# Patient Record
Sex: Male | Born: 1965 | Race: White | Hispanic: No | Marital: Married | Smoking: Never smoker
Health system: Southern US, Community
[De-identification: ages and names within clinical notes are randomized; demographics above are authoritative.]

## PROBLEM LIST (undated history)

## (undated) DIAGNOSIS — E039 Hypothyroidism, unspecified: Secondary | ICD-10-CM

## (undated) DIAGNOSIS — M1990 Unspecified osteoarthritis, unspecified site: Secondary | ICD-10-CM

## (undated) DIAGNOSIS — E079 Disorder of thyroid, unspecified: Secondary | ICD-10-CM

## (undated) DIAGNOSIS — I10 Essential (primary) hypertension: Secondary | ICD-10-CM

## (undated) DIAGNOSIS — J189 Pneumonia, unspecified organism: Secondary | ICD-10-CM

## (undated) DIAGNOSIS — Z8639 Personal history of other endocrine, nutritional and metabolic disease: Secondary | ICD-10-CM

## (undated) HISTORY — PX: JOINT REPLACEMENT: SHX530

---

## 2005-11-28 ENCOUNTER — Inpatient Hospital Stay (HOSPITAL_COMMUNITY): Admission: RE | Admit: 2005-11-28 | Discharge: 2005-12-01 | Payer: Self-pay | Admitting: Orthopedic Surgery

## 2005-12-04 ENCOUNTER — Encounter: Payer: Self-pay | Admitting: Vascular Surgery

## 2005-12-04 ENCOUNTER — Ambulatory Visit (HOSPITAL_COMMUNITY): Admission: RE | Admit: 2005-12-04 | Discharge: 2005-12-04 | Payer: Self-pay | Admitting: Orthopedic Surgery

## 2008-04-13 ENCOUNTER — Inpatient Hospital Stay (HOSPITAL_COMMUNITY): Admission: RE | Admit: 2008-04-13 | Discharge: 2008-04-17 | Payer: Self-pay | Admitting: Orthopedic Surgery

## 2009-03-05 HISTORY — PX: TOTAL THYROIDECTOMY: SHX2547

## 2010-06-20 LAB — CBC
HCT: 28.2 % — ABNORMAL LOW (ref 39.0–52.0)
HCT: 29 % — ABNORMAL LOW (ref 39.0–52.0)
HCT: 29.8 % — ABNORMAL LOW (ref 39.0–52.0)
HCT: 37.7 % — ABNORMAL LOW (ref 39.0–52.0)
Hemoglobin: 10 g/dL — ABNORMAL LOW (ref 13.0–17.0)
Hemoglobin: 10.1 g/dL — ABNORMAL LOW (ref 13.0–17.0)
Hemoglobin: 12.4 g/dL — ABNORMAL LOW (ref 13.0–17.0)
Hemoglobin: 9.7 g/dL — ABNORMAL LOW (ref 13.0–17.0)
MCHC: 32.8 g/dL (ref 30.0–36.0)
MCHC: 34.4 g/dL (ref 30.0–36.0)
MCV: 74.4 fL — ABNORMAL LOW (ref 78.0–100.0)
MCV: 75.5 fL — ABNORMAL LOW (ref 78.0–100.0)
MCV: 76.2 fL — ABNORMAL LOW (ref 78.0–100.0)
Platelets: 166 10*3/uL (ref 150–400)
Platelets: 168 10*3/uL (ref 150–400)
Platelets: 169 10*3/uL (ref 150–400)
RBC: 3.75 MIL/uL — ABNORMAL LOW (ref 4.22–5.81)
RBC: 3.85 MIL/uL — ABNORMAL LOW (ref 4.22–5.81)
RBC: 3.89 MIL/uL — ABNORMAL LOW (ref 4.22–5.81)
RBC: 3.91 MIL/uL — ABNORMAL LOW (ref 4.22–5.81)
RBC: 4.99 MIL/uL (ref 4.22–5.81)
RDW: 15.4 % (ref 11.5–15.5)
WBC: 3.3 10*3/uL — ABNORMAL LOW (ref 4.0–10.5)
WBC: 5.7 10*3/uL (ref 4.0–10.5)
WBC: 6.1 10*3/uL (ref 4.0–10.5)
WBC: 6.2 10*3/uL (ref 4.0–10.5)
WBC: 8.5 10*3/uL (ref 4.0–10.5)

## 2010-06-20 LAB — URINALYSIS, ROUTINE W REFLEX MICROSCOPIC
Bilirubin Urine: NEGATIVE
Glucose, UA: NEGATIVE mg/dL
Ketones, ur: NEGATIVE mg/dL
Nitrite: NEGATIVE
Protein, ur: NEGATIVE mg/dL
pH: 5 (ref 5.0–8.0)

## 2010-06-20 LAB — COMPREHENSIVE METABOLIC PANEL
ALT: 55 U/L — ABNORMAL HIGH (ref 0–53)
BUN: 10 mg/dL (ref 6–23)
CO2: 24 mEq/L (ref 19–32)
Calcium: 9.9 mg/dL (ref 8.4–10.5)
GFR calc non Af Amer: >60 mL/min (ref >60)
Glucose, Bld: 108 mg/dL — ABNORMAL HIGH (ref 70–99)
Sodium: 139 mEq/L (ref 135–145)
Total Protein: 6.3 g/dL (ref 6.0–8.3)

## 2010-06-20 LAB — TYPE AND SCREEN: ABO/RH(D): O POS

## 2010-06-20 LAB — T4: T4, Total: 12.7 ug/dL — ABNORMAL HIGH (ref 5.0–12.5)

## 2010-06-20 LAB — BASIC METABOLIC PANEL
BUN: 5 mg/dL — ABNORMAL LOW (ref 6–23)
CO2: 27 mEq/L (ref 19–32)
CO2: 29 mEq/L (ref 19–32)
Chloride: 100 mEq/L (ref 96–112)
Chloride: 97 mEq/L (ref 96–112)
Potassium: 3.7 mEq/L (ref 3.5–5.1)
Potassium: 4.1 mEq/L (ref 3.5–5.1)

## 2010-06-20 LAB — PROTIME-INR
Prothrombin Time: 13.7 seconds (ref 11.6–15.2)
Prothrombin Time: 27.3 seconds — ABNORMAL HIGH (ref 11.6–15.2)

## 2010-06-20 LAB — URINE MICROSCOPIC-ADD ON

## 2010-06-20 LAB — THYROID ANTIBODIES
Thyroglobulin Ab: 1039.5 U/mL — ABNORMAL HIGH (ref 0.0–60.0)
Thyroperoxidase Ab SerPl-aCnc: 5267.1 U/mL — ABNORMAL HIGH (ref 0.0–60.0)

## 2010-06-20 LAB — APTT: aPTT: 34 seconds (ref 24–37)

## 2010-06-20 LAB — TSH: TSH: 0.011 u[IU]/mL — ABNORMAL LOW (ref 0.350–4.500)

## 2010-06-20 LAB — T3: T3, Total: 169.4 ng/dl (ref 80.0–204.0)

## 2010-07-18 NOTE — Op Note (Signed)
NAMEZYMIERE, TROSTLE                  ACCOUNT NO.:  [id number]   MEDICAL RECORD NO.:  [id number]          PATIENT TYPE:  INP   LOCATION:  0002                         FACILITY:  [facility]   PHYSICIAN:  Ollen Gross, M.D.    DATE OF BIRTH:  [birth year]   DATE OF PROCEDURE:  04/13/2008  DATE OF DISCHARGE:                               OPERATIVE REPORT   PREOPERATIVE DIAGNOSIS:  Avascular necrosis, left hip.   POSTOPERATIVE DIAGNOSIS:  Avascular necrosis, left hip.   PROCEDURE:  Left total hip arthroplasty.   SURGEON:  Dr. Lequita Halt.   ASSISTANT:  Avel Peace. P.A.-C.   ANESTHESIA:  General.   ESTIMATED BLOOD LOSS:  400.   DRAINS:  Hemovac x1.   COMPLICATIONS:  None.   CONDITION:  Stable to recovery.   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  He presents now for left total hip  arthroplasty.  He has had a previous successful right total hip  arthroplasty.   PROCEDURE IN DETAIL:  After successful administration of general  anesthetic, he was placed into the right lateral decubitus position with  left side up and held with the hip positioner.  The left lower extremity  was isolated from his perineum with plastic drapes and prepped and  draped in the usual sterile fashion.  Short posterolateral incision was  made with a 10 blade through subcutaneous tissue to the level of the  fascia lata was incised in line with the skin incision.  Sciatic nerve  was palpated and protected, and short external rotators isolated off the  femur.  Capsulectomy was performed, and his hip was dislocated.  The  center of the femoral head was marked, and a trial prosthesis was placed  such that the center of the trial head corresponds to the center of  native femoral head.  Osteotomy lines marked on the femoral neck and  osteotomy made with an oscillating saw.  The femoral  head was removed,  and the femur retracted anteriorly to gain acetabular exposure.   Acetabular retractors were placed and labrum and osteophytes removed.  Reaming started at 49 mm coursing in increments of 2 to 57 mm, and then  a 58-mm pinnacle acetabular shell was plate is impacted in anatomic  position with outstanding purchase.  We did not place any additional  dome screws.  The apex hole eliminator was placed, and then a permanent  40-mm neutral Ultamet metal liner was placed for metal-on-metal hip  replacement.   The femur was prepared with the canal finder and irrigation.  Axial  reaming was performed to 15.5 mm, proximal reaming to a 20 F and sleeve  machined to a large.  A 20 F large trial sleeve was placed with a 20 x  15 stem and a 36 plus 12 neck matching native anteversion.  The 40 plus  0 head was placed.  This reduces too easily.  We went to a 40 +3 which  had better tension.  The stability was outstanding with full extension,  full external rotation, 70 degrees flexion, 40 degrees adduction, 90  degrees internal rotation, 90 degrees of flexion and 70 degrees of  internal rotation.  By placing the left leg on top of the right knee it  felt as though the leg lengths were equal.  The hip was then dislocated  and trials removed.  The permanent 20 F large sleeve was placed with 20  x 15 stem, 36 plus 12 neck, matching native anteversion.  A 40 plus 3  head was placed and the hips reduced with same stability parameters.  The wound was copiously irrigated with saline solution and the short  rotators reattached to the femur through drill holes.  Fascia lata was  closed over a Hemovac drain with interrupted #1 Vicryl, subcutaneous  tissue closed with #1-0 and #2-0 Vicryl, and subcuticular running 4-0  Monocryl.  The incisions cleaned and dried, and Steri-Strips and bulky  sterile dressing were applied.  He was then placed into a knee  immobilizer, awakened and transported to  recovery in stable condition.      Ollen Gross, M.D.  Electronically Signed     FA/MEDQ  D:  04/13/2008  T:  04/13/2008  Job:  [id number]

## 2010-07-18 NOTE — Consult Note (Signed)
[name], [name]                  ACCOUNT NO.:  [id number]   MEDICAL RECORD NO.:  [id number]          PATIENT TYPE:  INP   LOCATION:  1603                         FACILITY:  [facility]   PHYSICIAN:  Reather Littler, M.D.       DATE OF BIRTH:  1965/10/19   DATE OF CONSULTATION:  DATE OF DISCHARGE:                                 CONSULTATION   REASON FOR CONSULTATION:  Probable hyperthyroidism.   HISTORY:  This is a 45 year old Caucasian male admitted for hip surgery  on April 13, 2008.  The patient apparently was found to have a pulse  rate of about 120 to 130 after surgery and was felt to have sinus  tachycardia of unclear etiology.  A TSH was done and was 0.01 and  endocrine consult was requested by the medical consultant.   On questioning the patient, he says that he has lost about 62 pounds in  the last 6 months.  He says that in October he had gastroenteritis with  vomiting and he thinks he lost weight at that time and he was apparently  sick for about 4 weeks; however, he has not regained any weight even  though his appetite is fairly normal.  He has not had any diarrhea.   Patient also has noticed that he has been fairly shaky, especially when  trying to eat in the last 3 to 4 months.  He did not think much about it  and has not discussed this with primary care physician.  He also has  some tendency to increased sweating although no significant heat  intolerance.  Patient does not think she feels excessively nervous or  short of breath.  His shakiness is a little better in the last day or so  with metoprolol that he had been started on.   PAST HISTORY:  1. Hip replacement, 2007.  2. Knee arthroscopy.   FAMILY HISTORY:  Negative for thyroid disease or diabetes.   PERSONAL HISTORY:  He is married.  He does not smoke currently.  He  lives in a town [address].   MEDICATIONS AT HOME:  He was not on any specific medications.   REVIEW OF SYSTEMS:  He has had some  problems with leg skin reactions  with allergies.  He has not had any hypertension, diabetes, recent  shortness of breath.  He had no change in his vision or headaches.   PHYSICAL EXAMINATION:  Patient is well-built and nourished.  He is  pleasant and cooperative.  He is in no acute distress.  His current  pulse is 100.  Blood pressure is 128/76.  EYES:  Externally normal.  There is no proptosis, lid lag, or stare.  ENT EXAM:  Shows normal mucous membranes.  NECK EXAM:  He has no lymphadenopathy or abnormal carotid.  His thyroid  is enlarged at least twice normal on the right side and 1-1/2 times  normal on the left side.  It is diffuse, smooth, and relatively firm.  CARDIAC EXAM:  Shows normal heart sounds and apex.  LUNGS:  Clear.  ABDOMEN:  No mass or tenderness.  EXTREMITIES:  Normal.  Reflexes are somewhat difficult to elicit but  appear slightly brisk.  He has no tremor.  His hands are not unusually  warm or sweaty.  He has no skin lesions or edema.   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  He surprisingly has only mild symptoms although he  does have tachycardia on exam.   PLAN:  Would be to continue beta-blockers increasing the dose to 150 mg  a day for better heart rate control.  He has been informed about the  options for treatment of his hyperthyroidism and he will discuss this  with his wife.  He will have an uptake and scan done while he is in the  hospital and we will also review his T4 an T3 levels when unavailable.  Thank you for the consultation.      Reather Littler, M.D.  Electronically Signed     AK/MEDQ  D:  04/15/2008  T:  04/15/2008  Job:  [id number]   cc:   Ollen Gross, M.D.  Fax: [phone]   Theodosia Paling, MD

## 2010-07-21 NOTE — H&P (Signed)
NAMECORY, [name]                  ACCOUNT NO.:  [id number]   MEDICAL RECORD NO.:  [id number]          PATIENT TYPE:  INP   LOCATION:                               FACILITY:  [facility]   PHYSICIAN:  Ollen Gross, M.D.    DATE OF BIRTH:  03-20-1965   DATE OF ADMISSION:  04/13/2008  DATE OF DISCHARGE:                              HISTORY & PHYSICAL   DATE OF ADMISSION:  April 13, 2008.   CHIEF COMPLAINT:  Painful left hip.   PRESENT ILLNESS:  The patient is a 45 year old gentleman with painful  range of motion, weightbearing and night pain in his left hip.  Evaluation showed that he did have bilateral hip AVN.  He has had a  right total hip arthroplasty.  Patient is now going to have a left total  hip arthroplasty due to the pain.   ALLERGIES:  No known drug allergies.   CURRENT MEDICATIONS:  Triamcinolone acetonide ointment for bilateral  lower extremity allergic reaction.   PAST MEDICAL HISTORY:  1. Includes AVN bilateral hip with a recent right total hip      arthroplasty.  2. Bilateral lower extremity allergic reaction.   REVIEW OF SYSTEMS:  Is negative for any neurologic issues.  He denies  any pulmonary issues.  CARDIOVASCULAR:  He denies any chest pains,  irregular heart rhythms, shortness of breath.  He did have a stress test  several years ago.  GI/GU:  Is unremarkable.  ENDOCRINE:  Is  unremarkable.  HEMATOLOGIC.  He did have some postoperative blood loss  anemia which resolved.   PAST SURGICAL HISTORY:  Includes a hip replacement in 2007, left knee  arthroscopy for ligament damage.  He denies any anesthesia  complications.   FAMILY MEDICAL HISTORY:  Leukemia on his father's side.   SOCIAL HISTORY:  The patient is married.  He has smoked in the past.  He  does not use street drugs or alcohol.  He lives with his family in two-  level single family home.   PHYSICAL EXAM:  VITALS:  Height is 6 feet 2 inches, weight is 250  pounds.  Blood pressure was 124/64, pulse  of 70, respirations 12,  patient is afebrile.  GENERAL:  This is a healthy-appearing, well-developed gentleman  conscious, alert and appropriate.  He appears to be a good historian.  HEENT:  Head was normocephalic.  Pupils equal, round and reactive.  Gross hearing is intact.  NECK:  Was supple.  No palpable lymphadenopathy.  Good range of motion.  CHEST:  Lung sounds were clear and equal bilaterally.  No wheezes,  rales, rhonchi.  HEART:  Regular rate and rhythm.  ABDOMEN:  Was soft, nontender.  Bowel sounds present.  EXTREMITIES:  The patient had good range of motion of his upper  extremities.  Lower Extremities:  Left hip had full extension.  He was  only able to flex it up to 90 degrees.  He had 30 degrees internal  rotation, 10 degrees of external rotation.  Right hip had full  extension, flexion up to 110.  He had  30 degrees of internal rotation,  20 degrees of external rotation.  PERIPHERAL VASCULAR:  The patient had 2+ carotid pulses.  No bruits.  Radial pulses were 2+, posterior tibial pulses were 2+.  He had no lower  extremity edema but he did have some very faint erythremic changes about  the lower extremities.  NEURO:  The patient was conscious, alert and appropriate.   IMPRESSION:  1. Avascular necrosis left hip.  2. Allergic reaction bilateral ankles.   PLAN:  The patient will undergo routine laboratories and tests prior to  having a left total hip arthroplasty by Dr. Lequita Halt at [facility] on April 13, 2008.      Jamelle Rushing, P.A.      Ollen Gross, M.D.  Electronically Signed    RWK/MEDQ  D:  03/16/2008  T:  03/16/2008  Job:  [id number]   cc:   Ollen Gross, M.D.  Fax: [phone]

## 2010-07-21 NOTE — Discharge Summary (Signed)
[name], [name]                  ACCOUNT NO.:  [id number]   MEDICAL RECORD NO.:  [id number]          PATIENT TYPE:  INP   LOCATION:  1605                         FACILITY:  [facility]   PHYSICIAN:  Ollen Gross, M.D.    DATE OF BIRTH:  11-May-1965   DATE OF ADMISSION:  11/28/2005  DATE OF DISCHARGE:  12/01/2005                                 DISCHARGE SUMMARY   ADMITTING DIAGNOSIS:  Avascular necrosis of bilateral hips, right more  symptomatic than left.   DISCHARGE DIAGNOSES:  1. Avascular necrosis right hip, status post right total hip arthroplasty.  2. Avascular necrosis left hip.  3. Mild acute blood loss anemia that did not require transfusion.   PROCEDURE:  On November 28, 2005, right total hip.  Surgeon Dr. Lequita Halt.  Assistant Avel Peace, P.A-C.  Anesthesia general.  Blood loss 400.   CONSULTATIONS:  None.   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   LABORATORY DATA:  Preop CBC with hemoglobin 13.9, hematocrit 40.6, white  cell count 4.9.  Serial H&H's were followed.  Hemoglobin down to 10.5 on  last.  H&H postop 10.1 and 28.9.  Preop PT and PTT 12.9 and 30 respectively,  INR 1.0.  Serial pro times were followed and last PT and INR 20.5 and 1.7.  Chem panel on admission all within normal limits with the exception of  mildly elevated ALT of 64.  Serial BMPs were followed.  Sodium dropped a  little bit from 139 to 134.  The remainder of the electrolytes remained  within normal limits.  Blood group type O positive.   X-RAYS:  Right hip on November 26, 2005 showed AVN bilateral hips, worse on  the right.  Portable pelvis and hip film postoperatively:  Right total hip  prosthesis in place without radiographic evidence of complicating features.   HOSPITAL COURSE:  The patient was admitted to [facility] and  tolerated the procedure well.  Later was transferred to the recovery room on  the orthopedic floor.  Started on PCA and p.o. analgesics for pain control  following surgery.  Given 24 hours of postop IV antibiotics.  Started on  Coumadin for DVT prophylaxis.  Physical therapy was consulted to assist with  gait training, ambulation, ADLs.  On day one, he was hurting a fair amount.  He was using his PCA.  Did not have a drain.  Started getting up with  physical therapy.  By day two, he was doing a little bit better with his  pain control.  Dressing was changed and incision looked good.  Ambulating  short distances of about 10 feet, although did a little bit better by the  next day.  He was seen in rounds of the weekend of December 01, 2005.  He  had been weaned over to p.o. meds, getting up a little bit better with  physical therapy, more independent and ambulating over 70 feet and was  ready  to go home.   DISCHARGE PLAN:  1. The patient was discharged home on December 01, 2005.  2. Discharge diagnoses:  Please see above.  3. Discharge medications:  Percocet, Robaxin, Coumadin and Lovenox.  4. Diet as tolerated.  5. Follow up in 2 weeks.  6. Activity:  Partial weightbearing right lower extremity 25-50%.  Hip      precautions, total hip protocol.  Home health PT.  Home health nursing.   DISPOSITION:  Home.   CONDITION ON DISCHARGE:  Improved.      [name], P.A.      Ollen Gross, M.D.  Electronically Signed    ALP/MEDQ  D:  12/19/2005  T:  12/21/2005  Job:  [id number]

## 2010-07-21 NOTE — Op Note (Signed)
NAMEJODI, KAPPES                  ACCOUNT NO.:  [id number]   MEDICAL RECORD NO.:  [id number]          PATIENT TYPE:  INP   LOCATION:  0006                         [facility]AC[facility]   PHYSICIAN:  Ollen Gross, M.D.    DATE OF BIRTH:  [birth year]   DATE OF PROCEDURE:  11/28/2005  DATE OF DISCHARGE:                                 OPERATIVE REPORT   PREOPERATIVE DIAGNOSES:  Avascular necrosis, right hip.   POSTOPERATIVE DIAGNOSES:  Avascular necrosis, right hip.   PROCEDURE:  Right total hip arthroplasty.   SURGEON:  Ollen Gross, M.D.   ASSISTANT:  Alexzandrew L. Julien Girt, P.A.   ANESTHESIA:  General.   ESTIMATED BLOOD LOSS:  Was 400 mL.   DRAINS:  None.   COMPLICATIONS:  None.   CONDITION:  To the recovery room.   CLINICAL NOTE:  Jermall is a 45 year old male with post traumatic  osteonecrosis, right hip.  He has had progressively worsening pain and  dysfunction with collapse of his femoral head.  He presents now for a total  hip arthroplasty.   PROCEDURE IN DETAIL:  After the successful administration of general  anesthetic, the patient is placed in the left lateral decubitus position  with the right side up and held with a hip positioner.  The right lower  extremity is isolated from its perineum with plastic drapes and prepped and  draped in usual sterile fashion.  A short posterolateral incision was made  with a #10 blade through subcutaneous tissue, to the level of the fascia  lata which was incised in line with the skin incision.  The sciatic nerve is  palpated and protected and the short rotators isolated off the femur.  A  capsulectomy is performed and the hip is dislocated.  The center of the  femoral head is marked and a trial prosthesis placed, such that the center  of the trial head corresponds to the center of the native femoral head.  Osteotomy lines marked on the femoral neck and osteotomy made with an  oscillating saw.  The femoral head is removed and then  the femur retracted  anteriorly to gain acetabular exposure.   Acetabular labrum and osteophytes are removed.  Reaming starts at 47 mm in  coursing increments of 2 up to 57 mm, and then a 58 mm Pinnacle acetabular  shell was placed in anatomic position and transfixed with two dome screws.  The apex hole eliminator is placed, and then the permanent 40 mm neutral  Ultamet metal liner is placed for a metal-on-metal hip replacement.   The femur is repaired with the canal finder and irrigation.  Axial reaming  is performed to 15.5 mm, proximal reaming to a 20-F, and the sleeve machine  to a large.  A 20 F large trial sleeve is placed with the 20 x 15 stem and a  36 plus 12 neck, matching his native anteversion.  A 40 plus zero head is  placed and hips reduced with great stability, full extension, full external  rotation, 70 degrees flexion, with 40 degrees adduction, 90 degrees internal  rotation,  90 degrees of flexion and 90 degrees internal rotation.  By  placing the right leg on top of the left it felt as though leg lengths were  equal.  Hip was then dislocated and the femoral trials removed.  The  permanent 20-F large sleeve with a 20 x 15 stem, a 36 plus 12 neck were then  placed with the stem neck matching his native anteversion.  The 40 plus zero  head is placed and the hips reduced with the same stability parameters.  The  wounds copiously irrigated with saline solution and the short external  rotators reattached to the femur through drill holes.  The fascia was closed  with interrupted #1 Vicryl, subcu closed with #1-0 and #2-0 Vicryl and  subcuticular running #4-0 Monocryl.  The incision was clean and dry.  Steri-  Strips and bulky sterile dressing applied.  The knee immobilizer is placed  and he is then awakened and transferred to recovery in stable condition.      Ollen Gross, M.D.  Electronically Signed     FA/MEDQ  D:  11/28/2005  T:  11/30/2005  Job:  [id number]

## 2010-07-21 NOTE — Discharge Summary (Signed)
[name], Wolfe                  ACCOUNT NO.:  [id number]   MEDICAL RECORD NO.:  [id number]          PATIENT TYPE:  INP   LOCATION:  1603                         FACILITY:  [facility]   PHYSICIAN:  Ollen Gross, M.D.    DATE OF BIRTH:  1965-04-15   DATE OF ADMISSION:  04/13/2008  DATE OF DISCHARGE:  04/17/2008                               DISCHARGE SUMMARY   ADMISSION DIAGNOSES:  1. Avascular necrosis, left hip.  2. Bilateral lower extremity allergic reaction.   DISCHARGE DIAGNOSES:  1. 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  2. Hyperthyroidism most likely Graves disease.  3. Bilateral lower extremity allergic reaction.  4. Mild postoperative acute blood loss anemia, did not require      transfusion.  5. Mild postoperative hyponatremia, stable.  6. Postoperative sinus tachycardia, likely related to Graves disease.   PROCEDURE:  April 13, 2008, left total hip.   SURGEON:  Ollen Gross, M.D.   ASSISTANT:  [name], P.A.C.   ANESTHESIA:  General.   CONSULTATIONS:  Endocrine, Reather Littler, M.D.   BRIEF HISTORY:  [name] is a 45 year old male with high-grade osteonecrosis  of the left hip with involvement of about 50% of femoral head with  collapse.  Now presents for total hip arthroplasty.  He has had a  successful right and now presents for the left.   LABORATORY DATA:  Preoperative CBC showed hemoglobin 12.4, hematocrit  37.7, white cell count 3.3, platelets 203.  Postoperative hemoglobin 9.6  and back up to 9.7.  Last H and H back up to 10 with hematocrit of 29.  PT/INR 13.7 and 1.0 with PTT of 34.  Serial pro-times followed per  Coumadin protocol.  Last PT/INR 32.6 and 2.9.  Chem panel on admission,  low albumin 3.4, slightly elevated ALT of 55.  Remaining chem panel  within normal limits.  BMETs followed.  Sodium did drop from 139 to 132  which stabilized at 132.  TSH level taken on April 13, 2008 very low  at 0.01.  He had a thyroid panel taken on April 15, 2008, T4 was  elevated at 12.7.  T3 was normal at 169.4.  Thyroglobulin antibody  extremely high at 1039.5.  Thyroid peroxidase antibody elevated at  5267.1.  Thyroid antibodies, thyroglobulin antibody high at 1039.5.  Preoperative UA, small leukocyte esterase, rare epithelials, calcium  oxalate crystals.  Blood group type O+.   DIAGNOSTICS:  1. EKG April 07, 2008:  Sinus tachycardia, otherwise normal,      confirmed by Dr. Deatra James.  2. Follow up EKG April 13, 2008:  Sinus tachycardia otherwise normal      confirmed by Dr. Tonny Bollman.  3. April 14, 2008:  Sinus tachycardia, possible left atrial      enlargement, left ventricle hypertrophy.  This has changed since      April 13, 2008 confirmed by Dr. Aggie Cosier.  4. X-rays two-view chest April 07, 2008:  No active disease.  5. Left hip film April 07, 2008:  Changes compatible with left  AVN.  6. Pelvis and hip film April 13, 2008:  Left hip arthroplasty      without fracture or apparent complication.   HOSPITAL COURSE:  The patient admitted to [facility], taken to  the OR and underwent above stated procedure without complication.  The  patient tolerated the procedure well, later transferred to the recovery  room on the orthopedic floor.  Given 24 hours postoperative IV  antibiotics.  Started on Coumadin for DVT prophylaxis.  Later that  evening, the patient became somewhat tachycardiac with heart rate in the  120-130s.  EKG was ordered which showed sinus tachycardia.  Preoperative, he had a slight elevation of 104 on his preoperative EKG.  He is a healthy 45 year old and hemoglobin was 12.4 postoperative.  Pain  was under relatively good control.  Denied any shortness of breath.  Sats were good.  We started him on a low-dose beta blocker and also  checked a TSH level at that time.  On the morning of day 1, he was  sitting up in bed.  His sinus  tachycardia had improved a little bit, it  was down to 118.  Hemoglobin was 9.7.  Sodium was a little low, but it  turned out to be stable.  Incision looked good.  We changed his  dressing.  We had started him on low-dose beta blocker for the  tachycardia.  Unfortunately, his TSH level was very low at 0.01 possible  consideration of hyperthyroidism.  We called a medical consult.  After  talking with the medical services, they felt due to his issues, that he  would best be served by being seen in Endocrine.  Endocrine consult was  called.  The patient was seen on postoperative day 2 by Dr. Lucianne Muss.  With  his levels, he had a thyroid panel taken.  Those levels were elevated  and also the antibodies were elevated, thinking that he had some more  inflammatory possibility with Graves disease or hashitoxicosis.  By day  2, his sodium was stable at 132.  We increased metoprolol to 75 twice a  day for better control.  Dr. Lucianne Muss recommended changing him to more of  an extended release.  From a therapy standpoint, he actually did very  well postoperative.  He was getting up and walking a short distance  initially and then increased his mobility.  The patient felt good and  mainly the only symptom/sign he had was his increased heart rate, but no  other significant symptoms of the hyperthyroidism.  He was changed over  to Toprol XL.  Blood pressure was stable.  He was tolerating his  medications well.  He was walking about 50 feet, stable, progressing  well.  Dr. Lucianne Muss recommended that he follow up with an endocrinologist  at [facility] within 2-3 weeks.  The patient is doing well and was  discharged home.   DISPOSITION:  He was discharged home over the weekend on April 17, 2008.   DISCHARGE MEDICATIONS:  1. OxyIR.  2. Robaxin.  3. Metoprolol.  4. Coumadin.   FOLLOW UP:  1. Follow up in 2 weeks after surgery with Dr. Lequita Halt, call for      appointment.  2. He is also to arrange an  appointment with Endocrinology at [facility] outpatient  2-3 weeks.   DIET:  As tolerated.   ACTIVITIES:  Partial weightbearing 25-50% with hip precaution and total  hip protocol.  Home health  PT and home health nursing.   CONDITION ON DISCHARGE:  Improving.      [name], P.A.C.      Ollen Gross, M.D.  Electronically Signed    ALP/MEDQ  D:  05/27/2008  T:  05/27/2008  Job:  [id number]   cc:   Ollen Gross, M.D.  Fax: [phone]   Reather Littler, M.D.  Fax: [phone]

## 2010-07-21 NOTE — H&P (Signed)
[name], [name]                  ACCOUNT NO.:  [id number]   MEDICAL RECORD NO.:  [id number]          PATIENT TYPE:  INP   LOCATION:  1605                         FACILITY:  [facility]   PHYSICIAN:  Ollen Gross, M.D.    DATE OF BIRTH:  August 15, 1965   DATE OF ADMISSION:  11/28/2005  DATE OF DISCHARGE:  12/01/2005                                HISTORY & PHYSICAL   Date of office visit, history and physical:  November 23, 2005.   CHIEF COMPLAINT:  Right hip pain.   HISTORY OF PRESENT ILLNESS:  The patient is a 45 year old male who has been  seen by Dr. Lequita Halt for ongoing significant right hip pain that has been  progressive in nature.  He developed significant pain in the right hip  following a fall at work back in May 2006.  Through workup he had been found  to have osteonecrosis of both hips.  The right is more symptomatic,  problematic than the left.  He, unfortunately, has gone on to significant  collapse.  It has felt he has reached a point where he would best be served  by undergoing hip replacement.  Risks and benefits have been discussed and  he has elected to proceed with surgery.   ALLERGIES:  No known drug allergies.   CURRENT MEDICATIONS:  Vicodin.   PAST MEDICAL HISTORY:  Negative.   PAST SURGICAL HISTORY:  Recent teeth extraction and back surgery about 20  years ago.   FAMILY HISTORY:  Leukemia on the father's side.  He also has a cousin with  cancer.   SOCIAL HISTORY:  Married, one son.  Occasional intake of alcohol weekly and  occasional tobacco use, smokes about one pack a week.   REVIEW OF SYSTEMS:  GENERAL:  No fevers, chills, night sweats.  NEUROLOGIC:  No seizure, syncope or paralysis.  RESPIRATORY:  No shortness of breath,  productive cough or hemoptysis.  CARDIOVASCULAR:  No chest pain, angina or  orthopnea.  GASTROINTESTINAL:  No nausea, vomiting, diarrhea or  constipation.  GENITOURINARY:  No dysuria, hematuria or discharge.  MUSCULOSKELETAL:  Right  hip.   PHYSICAL EXAMINATION:  VITAL SIGNS:  Pulse 88, respirations 12, blood  pressure 114/72.  GENERAL:  A 45 year old well-nourished, well-developed, in no acute  distress, alert and oriented and cooperative.  Average sensorium.  HEENT:  Normocephalic, atraumatic.  Pupils equal, round, and reactive.  Oropharynx clear.  EOMs intact.  NECK:  Supple.  CHEST:  Clear, anterior/posterior chest walls.  No rhonchi, rales or  wheezing.  CARDIAC:  Regular rate and rhythm, no murmur.  ABDOMEN:  Soft, nontender.  Bowel sounds are present.  RECTAL, BREASTS, GENITALIA:  Not done, not pertinent to present illness.  EXTREMITIES:  Right hip:  The right hip shows flexion of 90, 0 internal  rotation, 20 degrees of external rotation, 20 degrees of abduction.  The  left hip shows flexion of 100 degrees, internal rotation 20, external  rotation 30, abduction 30.   IMPRESSION:  Avascular necrosis, bilateral hips, right more symptomatic and  problematic than left.   PLAN:  Admit to [facility] to undergo right total hip  arthroplasty.  Surgery will be performed by Ollen Gross, M.D.      Alexzandrew L. Julien Girt, P.A.      Ollen Gross, M.D.  Electronically Signed    ALP/MEDQ  D:  12/02/2005  T:  12/03/2005  Job:  [id number]

## 2012-03-05 HISTORY — PX: OTHER SURGICAL HISTORY: SHX169

## 2013-04-06 NOTE — Progress Notes (Signed)
Dr. [facility]Aluisio [facility]-op appointment on 04/10/2013 at 1130 am! Thank you!

## 2013-04-07 ENCOUNTER — Encounter (HOSPITAL_COMMUNITY): Payer: Self-pay | Admitting: Pharmacy Technician

## 2013-04-07 ENCOUNTER — Other Ambulatory Visit: Payer: Self-pay | Admitting: Orthopedic Surgery

## 2013-04-09 NOTE — Patient Instructions (Addendum)
Michaela CornerMark C Chisholm  04/09/2013                           YOUR PROCEDURE IS SCHEDULED ON: 04/20/13               PLEASE REPORT TO SHORT STAY CENTER AT :  7:30 AM               CALL THIS NUMBER IF ANY PROBLEMS THE DAY OF SURGERY :               [phone]                                REMEMBER:   Do not eat food or drink liquids AFTER MIDNIGHT     Take these medicines the morning of surgery with A SIP OF WATER:  SYNTHROID / CALCITROL   Do not wear jewelry, make-up   Do not wear lotions, powders, or perfumes.   Do not shave legs or underarms 12 hrs. before surgery (men may shave face)  Do not bring valuables to the hospital.  Contacts, dentures or bridgework may not be worn into surgery.  Leave suitcase in the car. After surgery it may be brought to your room.  For patients admitted to the hospital more than one night, checkout time is    11:00 AM                       The day of discharge.   Patients discharged the day of surgery will not be allowed to drive home.              If going home same day of surgery, must have someone stay with you first              24 hrs at home and arrange for some one to drive you home from hospital.    Special Instructions:   Please read over the following fact sheets that you were given:               1. [place] PREPARING FOR SURGERY SHEET               2. INCENTIVE SPIROMETER               3. MRSA INFORMATION                                                X_____________________________________________________________________        Failure to follow these instructions may result in cancellation of your surgery

## 2013-04-10 ENCOUNTER — Encounter (HOSPITAL_COMMUNITY): Payer: Self-pay

## 2013-04-10 ENCOUNTER — Encounter (HOSPITAL_COMMUNITY)
Admission: RE | Admit: 2013-04-10 | Discharge: 2013-04-10 | Disposition: A | Discharge status: Home / Self Care | Payer: BC Managed Care – PPO | Source: Ambulatory Visit | Attending: Orthopedic Surgery | Admitting: Orthopedic Surgery

## 2013-04-10 DIAGNOSIS — Z01812 Encounter for preprocedural laboratory examination: Secondary | ICD-10-CM | POA: Insufficient documentation

## 2013-04-10 HISTORY — DX: Unspecified osteoarthritis, unspecified site: M19.90

## 2013-04-10 HISTORY — DX: Personal history of other endocrine, nutritional and metabolic disease: Z86.39

## 2013-04-10 HISTORY — DX: Disorder of thyroid, unspecified: E07.9

## 2013-04-10 HISTORY — DX: Pneumonia, unspecified organism: J18.9

## 2013-04-10 LAB — URINALYSIS, ROUTINE W REFLEX MICROSCOPIC
BILIRUBIN URINE: NEGATIVE
GLUCOSE, UA: NEGATIVE mg/dL
Hgb urine dipstick: NEGATIVE
Ketones, ur: NEGATIVE mg/dL
NITRITE: NEGATIVE
PH: 5.5 (ref 5.0–8.0)
Protein, ur: NEGATIVE mg/dL
SPECIFIC GRAVITY, URINE: 1.026 (ref 1.005–1.030)
Urobilinogen, UA: 0.2 mg/dL (ref 0.0–1.0)

## 2013-04-10 LAB — COMPREHENSIVE METABOLIC PANEL
ALBUMIN: 4 g/dL (ref 3.5–5.2)
ALT: 24 U/L (ref 0–53)
AST: 15 U/L (ref 0–37)
Alkaline Phosphatase: 94 U/L (ref 39–117)
BUN: 18 mg/dL (ref 6–23)
CHLORIDE: 98 meq/L (ref 96–112)
CO2: 30 meq/L (ref 19–32)
CREATININE: 1.05 mg/dL (ref 0.50–1.35)
Calcium: 9 mg/dL (ref 8.4–10.5)
GFR calc Af Amer: >90 mL/min (ref >90)
GFR, EST NON AFRICAN AMERICAN: 83 mL/min — AB (ref >90)
Glucose, Bld: 133 mg/dL — ABNORMAL HIGH (ref 70–99)
Potassium: 3.9 mEq/L (ref 3.7–5.3)
SODIUM: 138 meq/L (ref 137–147)
Total Bilirubin: 0.4 mg/dL (ref 0.3–1.2)
Total Protein: 8 g/dL (ref 6.0–8.3)

## 2013-04-10 LAB — CBC
HCT: 43.2 % (ref 39.0–52.0)
Hemoglobin: 14 g/dL (ref 13.0–17.0)
MCH: 26.5 pg (ref 26.0–34.0)
MCHC: 32.4 g/dL (ref 30.0–36.0)
MCV: 81.8 fL (ref 78.0–100.0)
PLATELETS: 210 10*3/uL (ref 150–400)
RBC: 5.28 MIL/uL (ref 4.22–5.81)
RDW: 15.5 % (ref 11.5–15.5)
WBC: 7.3 10*3/uL (ref 4.0–10.5)

## 2013-04-10 LAB — URINE MICROSCOPIC-ADD ON

## 2013-04-10 LAB — PROTIME-INR
INR: 0.93 (ref 0.00–1.49)
Prothrombin Time: 12.3 seconds (ref 11.6–15.2)

## 2013-04-10 LAB — SURGICAL PCR SCREEN
MRSA, PCR: NEGATIVE
STAPHYLOCOCCUS AUREUS: POSITIVE — AB

## 2013-04-10 LAB — APTT: aPTT: 29 seconds (ref 24–37)

## 2013-04-10 NOTE — Progress Notes (Signed)
UA /+PCR faxed to Dr. Lequita HaltAluisio / Pt notified of result -Rx called to [facility] - [phone]

## 2013-04-19 ENCOUNTER — Other Ambulatory Visit: Payer: Self-pay | Admitting: Orthopedic Surgery

## 2013-04-19 MED ORDER — CEFAZOLIN SODIUM 10 G IJ SOLR
3.0000 g | INTRAMUSCULAR | Status: AC
  Administered 2013-04-20: 3 g via INTRAVENOUS
  Filled 2013-04-19: qty 3000

## 2013-04-19 NOTE — H&P (Signed)
[name]  DOB: 1966/02/26 Married / Language: English / Race: White Male  Date of Admission:  04-20-2013  Chief Complaint:  Left Knee Pain  History of Present Illness The patient is a 48 year old male who comes in for a preoperative History and Physical. The patient is scheduled for a left total knee arthroplasty (Attune System) to be performed by Dr. [name]. [name], MD at [facility] on 04-20-2013. The patient is a 48 year old male who presents for follow up of their knee. The patient is being followed for their bilateral knee pain and osteoarthritis. Symptoms reported today include: pain and swelling. The patient feels that they are doing poorly and report their pain level to be moderate (especially the left). The following medication has been used for pain control: none. The patient presents today following MRI. The patient has reported improvement of their symptoms with: ice. His left knee is bothering more than the right. It is hurting him at all times. He feels like he is limiting what he can and cannot do. He would like to be more active, but the knee is preventing him from doing so. He had the MRI scans which showed fairly extensive osteonecrosis. Both at the femurs and tibias far worse on the left than the right. He is ready to proceed with the left knee repalcement first. They have been treated conservatively in the past for the above stated problem and despite conservative measures, they continue to have progressive pain and severe functional limitations and dysfunction. They have failed non-operative management including home exercise, medications, and injections. It is felt that they would benefit from undergoing total joint replacement. Risks and benefits of the procedure have been discussed with the patient and they elect to proceed with surgery. There are no active contraindications to surgery such as ongoing infection or rapidly progressive neurological  disease.  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 requiring ICU admission - 2014 [facility] Hyperthyroidism. 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 04/25/2006 Hypothyroidism. Postsurgical Anxiety Disorder Hypoparathyroidism History of Postoperative Sinus Tachycardia. Likely due to Graves Disease   Family History No pertinent family history. First Degree Relatives.  Social History Tobacco use. Current some day smoker. current some days smoker; smoke(d) less than 1/2 pack(s) per day Tobacco / smoke exposure. no Alcohol use. current drinker; drinks beer; only occasionally per week Pain Contract. no Drug/Alcohol Rehab (Currently). no Drug/Alcohol Rehab (Previously). no Children. 2 Current work status. unemployed Exercise. Exercises daily; does other Marital status. married Number of flights of stairs before winded. greater than 5 Illicit drug use. no Living situation. live with spouse  Medication History Calcitriol (0.25MCG Capsule, Oral) Active. (qd) Synthroid ( Tablet, Oral) Active. (qd) Tums 500 (1250MG  Tablet Chewable, Oral) Active.   Past Surgical History Arthroscopy of Knee. left Thyroidectomy; Total. Date: 04/2009. Total Hip Replacement. bilateral Tracheostomy. Date: 03/2012. Vent Dependent PMN requiring ICU Admission  Review of Systems General:Not Present- Chills, Fever, Night Sweats, Fatigue, Weight Gain, Weight Loss and Memory Loss. Skin:Not Present- Hives, Itching, Rash, Eczema and Lesions. HEENT:Not Present- Tinnitus, Headache, Double Vision, Visual Loss, Hearing Loss and Dentures. Respiratory:Not Present- Shortness of breath with exertion, Shortness of breath at rest, Allergies, Coughing up blood and Chronic Cough. Cardiovascular:Not Present-  Chest Pain, Racing/skipping heartbeats, Difficulty Breathing Lying Down, Murmur, Swelling and Palpitations. Gastrointestinal:Not Present- Bloody Stool, Heartburn, Abdominal Pain, Vomiting,  Nausea, Constipation, Diarrhea, Difficulty Swallowing, Jaundice and Loss of appetitie. Male Genitourinary:Not Present- Urinary frequency, Blood in Urine, Weak urinary stream, Discharge, Flank Pain, Incontinence, Painful Urination, Urgency, Urinary Retention and Urinating at Night. Musculoskeletal:Present- Joint Pain. Not Present- Muscle Weakness, Muscle Pain, Joint Swelling, Back Pain, Morning Stiffness and Spasms. Neurological:Not Present- Tremor, Dizziness, Blackout spells, Paralysis, Difficulty with balance and Weakness. Psychiatric:Not Present- Insomnia.    Vitals 03/31/2013 10:35 AM BP: 138/68 (Sitting, Left Arm, Standard)     Physical Exam The physical exam findings are as follows:   General Mental Status - Alert, cooperative and good historian. General Appearance- pleasant. Not in acute distress. Orientation- Oriented X3. Build & Nutrition- Overweight, Well nourished and Well developed.   Head and Neck Head- normocephalic, atraumatic . Neck Global Assessment- supple. no bruit auscultated on the right and no bruit auscultated on the left. TracheaNote: previous tracheostomy scar noted on anterior neck.   Eye Pupil- Bilateral- Regular and Round. Motion- Bilateral- EOMI.   Chest and Lung Exam Auscultation: Breath sounds:- clear at anterior chest wall and - clear at posterior chest wall. Adventitious sounds:- No Adventitious sounds.   Cardiovascular Auscultation:Rhythm- Regular rate and rhythm. Heart Sounds- S1 WNL and S2 WNL. Murmurs & Other Heart Sounds:Auscultation of the heart reveals - No Murmurs.   Abdomen Inspection:Contour- Generalized moderate distention. Palpation/Percussion:Tenderness- Abdomen is non-tender to palpation.  Rigidity (guarding)- Abdomen is soft. Auscultation:Auscultation of the abdomen reveals - Bowel sounds normal.   Male Genitourinary Not done, not pertinent to present illness  Musculoskeletal Well developed male, in no distress. Both knees showed no effusion. Left knee with moderate crepitus on range of motion with range about 0 to 135, tender medial greater than lateral. No instability. Right knee, no effusion. Range is 0 to 135. No tenderness or instability.   Diagnostic Study: MRI scans. He hasHe also has a small meniscal tear medially and laterally on the left.   Assessment & Plan Primary osteoarthritis of both knees (715.16) AVN of both Knees Impression: Left greater Right Knee  Note: Plan is for a Left Total Knee Replacement by Dr. Lequita HaltAluisio.  Plan is to go home.  PCP - Dr. Liliana Clineavid Lee - Patient has been seen preoperatively and felt to be stable for surgery.  The patient does not have any contraindications and will receive TXA (tranexamic acid) prior to surgery.  Signed electronically by Lauraine RinneAlexzandrew L Lizvet Chunn, III PA-C

## 2013-04-20 ENCOUNTER — Encounter (HOSPITAL_COMMUNITY): Payer: Self-pay | Admitting: *Deleted

## 2013-04-20 ENCOUNTER — Inpatient Hospital Stay (HOSPITAL_COMMUNITY): Payer: BC Managed Care – PPO | Admitting: Registered Nurse

## 2013-04-20 ENCOUNTER — Inpatient Hospital Stay (HOSPITAL_COMMUNITY)
Admission: RE | Admit: 2013-04-20 | Discharge: 2013-04-22 | DRG: 470 | Disposition: A | Discharge status: Home Health | Payer: BC Managed Care – PPO | Source: Ambulatory Visit | Attending: Orthopedic Surgery | Admitting: Orthopedic Surgery

## 2013-04-20 ENCOUNTER — Encounter (HOSPITAL_COMMUNITY): Payer: BC Managed Care – PPO | Admitting: Registered Nurse

## 2013-04-20 ENCOUNTER — Encounter (HOSPITAL_COMMUNITY)
Admission: RE | Disposition: A | Discharge status: Home Health | Payer: Self-pay | Source: Ambulatory Visit | Attending: Orthopedic Surgery

## 2013-04-20 DIAGNOSIS — Z01812 Encounter for preprocedural laboratory examination: Secondary | ICD-10-CM

## 2013-04-20 DIAGNOSIS — M1710 Unilateral primary osteoarthritis, unspecified knee: Secondary | ICD-10-CM | POA: Diagnosis present

## 2013-04-20 DIAGNOSIS — Z79899 Other long term (current) drug therapy: Secondary | ICD-10-CM

## 2013-04-20 DIAGNOSIS — Z6841 Body Mass Index (BMI) 40.0 and over, adult: Secondary | ICD-10-CM

## 2013-04-20 DIAGNOSIS — Z96649 Presence of unspecified artificial hip joint: Secondary | ICD-10-CM

## 2013-04-20 DIAGNOSIS — E039 Hypothyroidism, unspecified: Secondary | ICD-10-CM | POA: Diagnosis present

## 2013-04-20 DIAGNOSIS — E669 Obesity, unspecified: Secondary | ICD-10-CM | POA: Diagnosis present

## 2013-04-20 DIAGNOSIS — Z96652 Presence of left artificial knee joint: Secondary | ICD-10-CM

## 2013-04-20 DIAGNOSIS — M8700 Idiopathic aseptic necrosis of unspecified bone: Principal | ICD-10-CM | POA: Diagnosis present

## 2013-04-20 DIAGNOSIS — M179 Osteoarthritis of knee, unspecified: Secondary | ICD-10-CM | POA: Diagnosis present

## 2013-04-20 DIAGNOSIS — E871 Hypo-osmolality and hyponatremia: Secondary | ICD-10-CM | POA: Diagnosis not present

## 2013-04-20 DIAGNOSIS — M898X9 Other specified disorders of bone, unspecified site: Secondary | ICD-10-CM | POA: Diagnosis present

## 2013-04-20 DIAGNOSIS — F17200 Nicotine dependence, unspecified, uncomplicated: Secondary | ICD-10-CM | POA: Diagnosis present

## 2013-04-20 DIAGNOSIS — F411 Generalized anxiety disorder: Secondary | ICD-10-CM | POA: Diagnosis present

## 2013-04-20 DIAGNOSIS — E0500 Thyrotoxicosis with diffuse goiter without thyrotoxic crisis or storm: Secondary | ICD-10-CM | POA: Diagnosis present

## 2013-04-20 HISTORY — DX: Hypothyroidism, unspecified: E03.9

## 2013-04-20 HISTORY — PX: TOTAL KNEE ARTHROPLASTY: SHX125

## 2013-04-20 LAB — TYPE AND SCREEN
ABO/RH(D): O POS
Antibody Screen: NEGATIVE

## 2013-04-20 SURGERY — ARTHROPLASTY, KNEE, TOTAL
Anesthesia: Spinal | Site: Knee | Laterality: Left

## 2013-04-20 MED ORDER — LACTATED RINGERS IV SOLN
INTRAVENOUS | Status: DC | PRN
  Administered 2013-04-20: 10:00:00 via INTRAVENOUS

## 2013-04-20 MED ORDER — RIVAROXABAN 10 MG PO TABS
10.0000 mg | ORAL_TABLET | Freq: Every day | ORAL | Status: DC
  Administered 2013-04-21 – 2013-04-22 (×2): 10 mg via ORAL
  Filled 2013-04-20 (×3): qty 1

## 2013-04-20 MED ORDER — FLEET ENEMA 7-19 GM/118ML RE ENEM
1.0000 | ENEMA | Freq: Once | RECTAL | Status: AC | PRN
Start: 2013-04-20 — End: 2013-04-20

## 2013-04-20 MED ORDER — OXYCODONE HCL 5 MG PO TABS
5.0000 mg | ORAL_TABLET | ORAL | Status: DC | PRN
  Administered 2013-04-20 – 2013-04-22 (×9): 10 mg via ORAL
  Filled 2013-04-20 (×9): qty 2

## 2013-04-20 MED ORDER — METOCLOPRAMIDE HCL 5 MG/ML IJ SOLN: 5.0000 mg | Freq: Three times a day (TID) | INTRAMUSCULAR | Status: DC | PRN

## 2013-04-20 MED ORDER — ACETAMINOPHEN 500 MG PO TABS
1000.0000 mg | ORAL_TABLET | Freq: Four times a day (QID) | ORAL | Status: AC
  Administered 2013-04-20 – 2013-04-21 (×4): 1000 mg via ORAL
  Filled 2013-04-20 (×4): qty 2

## 2013-04-20 MED ORDER — PROPOFOL INFUSION 10 MG/ML OPTIME
INTRAVENOUS | Status: DC | PRN
  Administered 2013-04-20: 100 ug/kg/min via INTRAVENOUS

## 2013-04-20 MED ORDER — POLYETHYLENE GLYCOL 3350 17 G PO PACK: 17.0000 g | PACK | Freq: Every day | ORAL | Status: DC | PRN

## 2013-04-20 MED ORDER — LACTATED RINGERS IV SOLN: INTRAVENOUS | Status: DC

## 2013-04-20 MED ORDER — MORPHINE SULFATE 2 MG/ML IJ SOLN
1.0000 mg | INTRAMUSCULAR | Status: DC | PRN
  Administered 2013-04-20 (×2): 2 mg via INTRAVENOUS
  Filled 2013-04-20 (×2): qty 1

## 2013-04-20 MED ORDER — LIDOCAINE HCL (CARDIAC) 20 MG/ML IV SOLN
INTRAVENOUS | Status: AC
  Filled 2013-04-20: qty 5

## 2013-04-20 MED ORDER — BUPIVACAINE HCL 0.25 % IJ SOLN
INTRAMUSCULAR | Status: DC | PRN
Start: 2013-04-20 — End: 2013-04-20
  Administered 2013-04-20: 30 mL

## 2013-04-20 MED ORDER — DIPHENHYDRAMINE HCL 12.5 MG/5ML PO ELIX: 12.5000 mg | ORAL_SOLUTION | ORAL | Status: DC | PRN

## 2013-04-20 MED ORDER — MIDAZOLAM HCL 2 MG/2ML IJ SOLN
INTRAMUSCULAR | Status: AC
  Filled 2013-04-20: qty 2

## 2013-04-20 MED ORDER — CEFAZOLIN SODIUM-DEXTROSE 2-3 GM-% IV SOLR
2.0000 g | Freq: Four times a day (QID) | INTRAVENOUS | Status: AC
  Administered 2013-04-20 (×2): 2 g via INTRAVENOUS
  Filled 2013-04-20 (×2): qty 50

## 2013-04-20 MED ORDER — METOCLOPRAMIDE HCL 10 MG PO TABS: 5.0000 mg | ORAL_TABLET | Freq: Three times a day (TID) | ORAL | Status: DC | PRN

## 2013-04-20 MED ORDER — CHLORHEXIDINE GLUCONATE 4 % EX LIQD: 60.0000 mL | Freq: Once | CUTANEOUS | Status: DC

## 2013-04-20 MED ORDER — DEXAMETHASONE SODIUM PHOSPHATE 10 MG/ML IJ SOLN
10.0000 mg | Freq: Every day | INTRAMUSCULAR | Status: AC
  Administered 2013-04-21: 10 mg via INTRAVENOUS
  Filled 2013-04-20: qty 1

## 2013-04-20 MED ORDER — BUPIVACAINE HCL (PF) 0.25 % IJ SOLN
INTRAMUSCULAR | Status: AC
  Filled 2013-04-20: qty 30

## 2013-04-20 MED ORDER — DEXAMETHASONE 4 MG PO TABS
10.0000 mg | ORAL_TABLET | Freq: Every day | ORAL | Status: AC
  Filled 2013-04-20: qty 1

## 2013-04-20 MED ORDER — POTASSIUM CHLORIDE IN NACL 20-0.9 MEQ/L-% IV SOLN
INTRAVENOUS | Status: AC
  Administered 2013-04-20: 1000 mL via INTRAVENOUS
  Filled 2013-04-20: qty 1000

## 2013-04-20 MED ORDER — PROMETHAZINE HCL 25 MG/ML IJ SOLN: 6.2500 mg | INTRAMUSCULAR | Status: DC | PRN

## 2013-04-20 MED ORDER — PROPOFOL 10 MG/ML IV BOLUS
INTRAVENOUS | Status: AC
  Filled 2013-04-20: qty 20

## 2013-04-20 MED ORDER — METHOCARBAMOL 100 MG/ML IJ SOLN
500.0000 mg | Freq: Four times a day (QID) | INTRAVENOUS | Status: DC | PRN
  Administered 2013-04-20: 500 mg via INTRAVENOUS
  Filled 2013-04-20: qty 5

## 2013-04-20 MED ORDER — MIDAZOLAM HCL 5 MG/5ML IJ SOLN
INTRAMUSCULAR | Status: DC | PRN
  Administered 2013-04-20: 2 mg via INTRAVENOUS

## 2013-04-20 MED ORDER — DEXAMETHASONE SODIUM PHOSPHATE 10 MG/ML IJ SOLN: 10.0000 mg | Freq: Once | INTRAMUSCULAR | Status: DC

## 2013-04-20 MED ORDER — STERILE WATER FOR IRRIGATION IR SOLN
Status: DC | PRN
  Administered 2013-04-20: 1500 mL

## 2013-04-20 MED ORDER — BISACODYL 10 MG RE SUPP: 10.0000 mg | Freq: Every day | RECTAL | Status: DC | PRN

## 2013-04-20 MED ORDER — ONDANSETRON HCL 4 MG PO TABS: 4.0000 mg | ORAL_TABLET | Freq: Four times a day (QID) | ORAL | Status: DC | PRN

## 2013-04-20 MED ORDER — BUPIVACAINE LIPOSOME 1.3 % IJ SUSP
20.0000 mL | Freq: Once | INTRAMUSCULAR | Status: DC
  Filled 2013-04-20: qty 20

## 2013-04-20 MED ORDER — ONDANSETRON HCL 4 MG/2ML IJ SOLN: 4.0000 mg | Freq: Four times a day (QID) | INTRAMUSCULAR | Status: DC | PRN

## 2013-04-20 MED ORDER — FENTANYL CITRATE 0.05 MG/ML IJ SOLN
INTRAMUSCULAR | Status: AC
  Filled 2013-04-20: qty 2

## 2013-04-20 MED ORDER — POTASSIUM CHLORIDE IN NACL 20-0.9 MEQ/L-% IV SOLN
INTRAVENOUS | Status: DC
  Administered 2013-04-20: 1000 mL via INTRAVENOUS
  Administered 2013-04-21 (×2): via INTRAVENOUS
  Filled 2013-04-20 (×5): qty 1000

## 2013-04-20 MED ORDER — METHOCARBAMOL 500 MG PO TABS
500.0000 mg | ORAL_TABLET | Freq: Four times a day (QID) | ORAL | Status: DC | PRN
  Administered 2013-04-21 (×2): 500 mg via ORAL
  Filled 2013-04-20 (×2): qty 1

## 2013-04-20 MED ORDER — KETAMINE HCL 10 MG/ML IJ SOLN
INTRAMUSCULAR | Status: DC | PRN
  Administered 2013-04-20: 50 mg via INTRAVENOUS

## 2013-04-20 MED ORDER — MEPERIDINE HCL 50 MG/ML IJ SOLN: 6.2500 mg | INTRAMUSCULAR | Status: DC | PRN

## 2013-04-20 MED ORDER — ACETAMINOPHEN 500 MG PO TABS
1000.0000 mg | ORAL_TABLET | Freq: Once | ORAL | Status: AC
  Administered 2013-04-20: 1000 mg via ORAL
  Filled 2013-04-20: qty 2

## 2013-04-20 MED ORDER — SODIUM CHLORIDE 0.9 % IJ SOLN
INTRAMUSCULAR | Status: DC | PRN
  Administered 2013-04-20: 12:00:00

## 2013-04-20 MED ORDER — KETOROLAC TROMETHAMINE 15 MG/ML IJ SOLN
7.5000 mg | Freq: Four times a day (QID) | INTRAMUSCULAR | Status: AC | PRN
  Filled 2013-04-20: qty 1

## 2013-04-20 MED ORDER — DOCUSATE SODIUM 100 MG PO CAPS
100.0000 mg | ORAL_CAPSULE | Freq: Two times a day (BID) | ORAL | Status: DC
  Administered 2013-04-20 – 2013-04-22 (×4): 100 mg via ORAL

## 2013-04-20 MED ORDER — ONDANSETRON HCL 4 MG/2ML IJ SOLN
INTRAMUSCULAR | Status: DC | PRN
  Administered 2013-04-20: 4 mg via INTRAVENOUS

## 2013-04-20 MED ORDER — KETAMINE HCL 10 MG/ML IJ SOLN
INTRAMUSCULAR | Status: AC
  Filled 2013-04-20: qty 1

## 2013-04-20 MED ORDER — LEVOTHYROXINE SODIUM 200 MCG PO TABS
200.0000 ug | ORAL_TABLET | Freq: Every day | ORAL | Status: DC
  Administered 2013-04-21 – 2013-04-22 (×2): 200 ug via ORAL
  Filled 2013-04-20 (×3): qty 1

## 2013-04-20 MED ORDER — BUPIVACAINE IN DEXTROSE 0.75-8.25 % IT SOLN
INTRATHECAL | Status: DC | PRN
  Administered 2013-04-20: 1.6 mL via INTRATHECAL

## 2013-04-20 MED ORDER — ONDANSETRON HCL 4 MG/2ML IJ SOLN
INTRAMUSCULAR | Status: AC
  Filled 2013-04-20: qty 2

## 2013-04-20 MED ORDER — 0.9 % SODIUM CHLORIDE (POUR BTL) OPTIME
TOPICAL | Status: DC | PRN
  Administered 2013-04-20: 1000 mL

## 2013-04-20 MED ORDER — PHENOL 1.4 % MT LIQD: 1.0000 | OROMUCOSAL | Status: DC | PRN

## 2013-04-20 MED ORDER — CHLORHEXIDINE GLUCONATE CLOTH 2 % EX PADS: 6.0000 | MEDICATED_PAD | Freq: Once | CUTANEOUS | Status: DC

## 2013-04-20 MED ORDER — LIDOCAINE HCL (CARDIAC) 20 MG/ML IV SOLN
INTRAVENOUS | Status: DC | PRN
  Administered 2013-04-20: 100 mg via INTRAVENOUS

## 2013-04-20 MED ORDER — SODIUM CHLORIDE 0.9 % IR SOLN
Status: DC | PRN
  Administered 2013-04-20: 1000 mL

## 2013-04-20 MED ORDER — SODIUM CHLORIDE 0.9 % IJ SOLN
INTRAMUSCULAR | Status: AC
  Filled 2013-04-20: qty 50

## 2013-04-20 MED ORDER — TRAMADOL HCL 50 MG PO TABS: 50.0000 mg | ORAL_TABLET | Freq: Four times a day (QID) | ORAL | Status: DC | PRN

## 2013-04-20 MED ORDER — SODIUM CHLORIDE 0.9 % IV SOLN: INTRAVENOUS | Status: DC

## 2013-04-20 MED ORDER — TRANEXAMIC ACID 100 MG/ML IV SOLN
1000.0000 mg | INTRAVENOUS | Status: AC
  Administered 2013-04-20: 1000 mg via INTRAVENOUS
  Filled 2013-04-20: qty 10

## 2013-04-20 MED ORDER — FENTANYL CITRATE 0.05 MG/ML IJ SOLN
INTRAMUSCULAR | Status: DC | PRN
Start: 2013-04-20 — End: 2013-04-20
  Administered 2013-04-20: 50 ug via INTRAVENOUS

## 2013-04-20 MED ORDER — ACETAMINOPHEN 325 MG PO TABS: 650.0000 mg | ORAL_TABLET | Freq: Four times a day (QID) | ORAL | Status: DC | PRN

## 2013-04-20 MED ORDER — MENTHOL 3 MG MT LOZG
1.0000 | LOZENGE | OROMUCOSAL | Status: DC | PRN
  Filled 2013-04-20: qty 9

## 2013-04-20 MED ORDER — ACETAMINOPHEN 650 MG RE SUPP: 650.0000 mg | Freq: Four times a day (QID) | RECTAL | Status: DC | PRN

## 2013-04-20 MED ORDER — HYDROMORPHONE HCL PF 1 MG/ML IJ SOLN: 0.2500 mg | INTRAMUSCULAR | Status: DC | PRN

## 2013-04-20 SURGICAL SUPPLY — 55 items
BAG ZIPLOCK 12X15 (MISCELLANEOUS) ×3 IMPLANT
BANDAGE ELASTIC 6 VELCRO ST LF (GAUZE/BANDAGES/DRESSINGS) ×3 IMPLANT
BANDAGE ESMARK 6X9 LF (GAUZE/BANDAGES/DRESSINGS) ×1 IMPLANT
BLADE SAG 18X100X1.27 (BLADE) ×3 IMPLANT
BLADE SAW SGTL 11.0X1.19X90.0M (BLADE) ×3 IMPLANT
BNDG ESMARK 6X9 LF (GAUZE/BANDAGES/DRESSINGS) ×3
BOWL SMART MIX CTS (DISPOSABLE) ×3 IMPLANT
CAP KNEE ATTUNE RP ×3 IMPLANT
CEMENT HV SMART SET (Cement) ×6 IMPLANT
CLOSURE STERI-STRIP 1/4X4 (GAUZE/BANDAGES/DRESSINGS) ×3 IMPLANT
CLOSURE WOUND 1/2 X4 (GAUZE/BANDAGES/DRESSINGS) ×2
CUFF TOURN SGL QUICK 34 (TOURNIQUET CUFF) ×2
CUFF TRNQT CYL 34X4X40X1 (TOURNIQUET CUFF) ×1 IMPLANT
DECANTER SPIKE VIAL GLASS SM (MISCELLANEOUS) ×3 IMPLANT
DRAPE EXTREMITY T 121X128X90 (DRAPE) ×3 IMPLANT
DRAPE POUCH INSTRU U-SHP 10X18 (DRAPES) ×3 IMPLANT
DRAPE U-SHAPE 47X51 STRL (DRAPES) ×3 IMPLANT
DRSG ADAPTIC 3X8 NADH LF (GAUZE/BANDAGES/DRESSINGS) ×3 IMPLANT
DURAPREP 26ML APPLICATOR (WOUND CARE) ×3 IMPLANT
ELECT REM PT RETURN 9FT ADLT (ELECTROSURGICAL) ×3
ELECTRODE REM PT RTRN 9FT ADLT (ELECTROSURGICAL) ×1 IMPLANT
EVACUATOR 1/8 PVC DRAIN (DRAIN) ×3 IMPLANT
FACESHIELD LNG OPTICON STERILE (SAFETY) ×15 IMPLANT
GLOVE BIO SURGEON STRL SZ7.5 (GLOVE) IMPLANT
GLOVE BIO SURGEON STRL SZ8 (GLOVE) ×3 IMPLANT
GLOVE BIOGEL PI IND STRL 8 (GLOVE) ×2 IMPLANT
GLOVE BIOGEL PI INDICATOR 8 (GLOVE) ×4
GLOVE SURG SS PI 6.5 STRL IVOR (GLOVE) IMPLANT
GOWN STRL REUS W/TWL LRG LVL3 (GOWN DISPOSABLE) ×3 IMPLANT
GOWN STRL REUS W/TWL XL LVL3 (GOWN DISPOSABLE) IMPLANT
HANDPIECE INTERPULSE COAX TIP (DISPOSABLE) ×2
IMMOBILIZER KNEE 20 (SOFTGOODS) ×3 IMPLANT
KIT BASIN OR (CUSTOM PROCEDURE TRAY) ×3 IMPLANT
MANIFOLD NEPTUNE II (INSTRUMENTS) ×3 IMPLANT
NDL SAFETY ECLIPSE 18X1.5 (NEEDLE) ×2 IMPLANT
NEEDLE HYPO 18GX1.5 SHARP (NEEDLE) ×4
NS IRRIG 1000ML POUR BTL (IV SOLUTION) ×3 IMPLANT
PACK TOTAL JOINT (CUSTOM PROCEDURE TRAY) ×3 IMPLANT
PAD ABD 8X10 STRL (GAUZE/BANDAGES/DRESSINGS) ×3 IMPLANT
PADDING CAST COTTON 6X4 STRL (CAST SUPPLIES) ×3 IMPLANT
POSITIONER SURGICAL ARM (MISCELLANEOUS) ×3 IMPLANT
SET HNDPC FAN SPRY TIP SCT (DISPOSABLE) ×1 IMPLANT
SPONGE GAUZE 4X4 12PLY (GAUZE/BANDAGES/DRESSINGS) ×3 IMPLANT
STRIP CLOSURE SKIN 1/2X4 (GAUZE/BANDAGES/DRESSINGS) ×4 IMPLANT
SUCTION FRAZIER 12FR DISP (SUCTIONS) ×3 IMPLANT
SUT MNCRL AB 4-0 PS2 18 (SUTURE) ×3 IMPLANT
SUT VIC AB 2-0 CT1 27 (SUTURE) ×6
SUT VIC AB 2-0 CT1 TAPERPNT 27 (SUTURE) ×3 IMPLANT
SUT VLOC 180 0 24IN GS25 (SUTURE) ×3 IMPLANT
SYR 20CC LL (SYRINGE) ×3 IMPLANT
SYR 50ML LL SCALE MARK (SYRINGE) ×3 IMPLANT
TOWEL OR 17X26 10 PK STRL BLUE (TOWEL DISPOSABLE) ×6 IMPLANT
TRAY FOLEY CATH 14FRSI W/METER (CATHETERS) ×3 IMPLANT
WATER STERILE IRR 1500ML POUR (IV SOLUTION) ×3 IMPLANT
WRAP KNEE MAXI GEL POST OP (GAUZE/BANDAGES/DRESSINGS) ×3 IMPLANT

## 2013-04-20 NOTE — Anesthesia Procedure Notes (Signed)
Spinal  Patient location during procedure: OR Staffing Anesthesiologist: Tarrell Debes Performed by: anesthesiologist  Preanesthetic Checklist Completed: patient identified, site marked, surgical consent, pre-op evaluation, timeout performed, IV checked, risks and benefits discussed and monitors and equipment checked Spinal Block Patient position: sitting Prep: Betadine Patient monitoring: heart rate, continuous pulse ox and blood pressure Approach: right paramedian Location: L4-5 Injection technique: single-shot Needle Needle type: Spinocan  Needle gauge: 22 G Needle length: 9 cm Additional Notes Expiration date of kit checked and confirmed. Patient tolerated procedure well, without complications.     

## 2013-04-20 NOTE — Progress Notes (Signed)
RN called RT about patient sats being low when patient goes to sleep. RT is at the bedside observing patient. Patient is having agonal breathing and is sating in the 70's when he falls asleep. RT placed a 30% venti mask on pt but sats are still dropping during sleep.RT asked RN to call MD for a cpap order.

## 2013-04-20 NOTE — Anesthesia Preprocedure Evaluation (Addendum)
Anesthesia Evaluation  Patient identified by MRN, date of birth, ID band Patient awake    Reviewed: Allergy & Precautions, H&P , NPO status , Patient's Chart, lab work & pertinent test results  History of Anesthesia Complications (+) AWARENESS UNDER ANESTHESIA  Airway Mallampati: II TM Distance: >3 FB Neck ROM: Full    Dental no notable dental hx.    Pulmonary neg pulmonary ROS, former smoker,  breath sounds clear to auscultation  Pulmonary exam normal       Cardiovascular negative cardio ROS  Rhythm:Regular Rate:Normal     Neuro/Psych negative neurological ROS  negative psych ROS   GI/Hepatic negative GI ROS, Neg liver ROS,   Endo/Other  negative endocrine ROSMorbid obesity  Renal/GU negative Renal ROS  negative genitourinary   Musculoskeletal negative musculoskeletal ROS (+)   Abdominal   Peds negative pediatric ROS (+)  Hematology negative hematology ROS (+)   Anesthesia Other Findings   Reproductive/Obstetrics negative OB ROS                         Anesthesia Physical Anesthesia Plan  ASA: II  Anesthesia Plan: Spinal   Post-op Pain Management:    Induction:   Airway Management Planned: Simple Face Mask  Additional Equipment:   Intra-op Plan:   Post-operative Plan:   Informed Consent: I have reviewed the patients History and Physical, chart, labs and discussed the procedure including the risks, benefits and alternatives for the proposed anesthesia with the patient or authorized representative who has indicated his/her understanding and acceptance.   Dental advisory given  Plan Discussed with: CRNA  Anesthesia Plan Comments:         Anesthesia Quick Evaluation

## 2013-04-20 NOTE — Interval H&P Note (Signed)
History and Physical Interval Note:  04/20/2013 8:31 AM  [name]  has presented today for surgery, with the diagnosis of AVASCULAR NECROSIS OF LEFT KNEE  The various methods of treatment have been discussed with the patient and family. After consideration of risks, benefits and other options for treatment, the patient has consented to  Procedure(s): LEFT TOTAL KNEE ARTHROPLASTY (Left) as a surgical intervention .  The patient's history has been reviewed, patient examined, no change in status, stable for surgery.  I have reviewed the patient's chart and labs.  Questions were answered to the patient's satisfaction.     Loanne DrillingALUISIO,Michell Kader V

## 2013-04-20 NOTE — Preoperative (Signed)
Beta Blockers   Reason not to administer Beta Blockers:Not Applicable 

## 2013-04-20 NOTE — H&P (View-Only) (Signed)
[name]  DOB: 1966/02/26 Married / Language: English / Race: White Male  Date of Admission:  04-20-2013  Chief Complaint:  Left Knee Pain  History of Present Illness The patient is a 48 year old male who comes in for a preoperative History and Physical. The patient is scheduled for a left total knee arthroplasty (Attune System) to be performed by Dr. [name]. [name], MD at [facility] on 04-20-2013. The patient is a 48 year old male who presents for follow up of their knee. The patient is being followed for their bilateral knee pain and osteoarthritis. Symptoms reported today include: pain and swelling. The patient feels that they are doing poorly and report their pain level to be moderate (especially the left). The following medication has been used for pain control: none. The patient presents today following MRI. The patient has reported improvement of their symptoms with: ice. His left knee is bothering more than the right. It is hurting him at all times. He feels like he is limiting what he can and cannot do. He would like to be more active, but the knee is preventing him from doing so. He had the MRI scans which showed fairly extensive osteonecrosis. Both at the femurs and tibias far worse on the left than the right. He is ready to proceed with the left knee repalcement first. They have been treated conservatively in the past for the above stated problem and despite conservative measures, they continue to have progressive pain and severe functional limitations and dysfunction. They have failed non-operative management including home exercise, medications, and injections. It is felt that they would benefit from undergoing total joint replacement. Risks and benefits of the procedure have been discussed with the patient and they elect to proceed with surgery. There are no active contraindications to surgery such as ongoing infection or rapidly progressive neurological  disease.  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 requiring ICU admission - 2014 [facility] Hyperthyroidism. 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 04/25/2006 Hypothyroidism. Postsurgical Anxiety Disorder Hypoparathyroidism History of Postoperative Sinus Tachycardia. Likely due to Graves Disease   Family History No pertinent family history. First Degree Relatives.  Social History Tobacco use. Current some day smoker. current some days smoker; smoke(d) less than 1/2 pack(s) per day Tobacco / smoke exposure. no Alcohol use. current drinker; drinks beer; only occasionally per week Pain Contract. no Drug/Alcohol Rehab (Currently). no Drug/Alcohol Rehab (Previously). no Children. 2 Current work status. unemployed Exercise. Exercises daily; does other Marital status. married Number of flights of stairs before winded. greater than 5 Illicit drug use. no Living situation. live with spouse  Medication History Calcitriol (0.25MCG Capsule, Oral) Active. (qd) Synthroid ( Tablet, Oral) Active. (qd) Tums 500 (1250MG  Tablet Chewable, Oral) Active.   Past Surgical History Arthroscopy of Knee. left Thyroidectomy; Total. Date: 04/2009. Total Hip Replacement. bilateral Tracheostomy. Date: 03/2012. Vent Dependent PMN requiring ICU Admission  Review of Systems General:Not Present- Chills, Fever, Night Sweats, Fatigue, Weight Gain, Weight Loss and Memory Loss. Skin:Not Present- Hives, Itching, Rash, Eczema and Lesions. HEENT:Not Present- Tinnitus, Headache, Double Vision, Visual Loss, Hearing Loss and Dentures. Respiratory:Not Present- Shortness of breath with exertion, Shortness of breath at rest, Allergies, Coughing up blood and Chronic Cough. Cardiovascular:Not Present-  Chest Pain, Racing/skipping heartbeats, Difficulty Breathing Lying Down, Murmur, Swelling and Palpitations. Gastrointestinal:Not Present- Bloody Stool, Heartburn, Abdominal Pain, Vomiting,  Nausea, Constipation, Diarrhea, Difficulty Swallowing, Jaundice and Loss of appetitie. Male Genitourinary:Not Present- Urinary frequency, Blood in Urine, Weak urinary stream, Discharge, Flank Pain, Incontinence, Painful Urination, Urgency, Urinary Retention and Urinating at Night. Musculoskeletal:Present- Joint Pain. Not Present- Muscle Weakness, Muscle Pain, Joint Swelling, Back Pain, Morning Stiffness and Spasms. Neurological:Not Present- Tremor, Dizziness, Blackout spells, Paralysis, Difficulty with balance and Weakness. Psychiatric:Not Present- Insomnia.    Vitals 03/31/2013 10:35 AM BP: 138/68 (Sitting, Left Arm, Standard)     Physical Exam The physical exam findings are as follows:   General Mental Status - Alert, cooperative and good historian. General Appearance- pleasant. Not in acute distress. Orientation- Oriented X3. Build & Nutrition- Overweight, Well nourished and Well developed.   Head and Neck Head- normocephalic, atraumatic . Neck Global Assessment- supple. no bruit auscultated on the right and no bruit auscultated on the left. TracheaNote: previous tracheostomy scar noted on anterior neck.   Eye Pupil- Bilateral- Regular and Round. Motion- Bilateral- EOMI.   Chest and Lung Exam Auscultation: Breath sounds:- clear at anterior chest wall and - clear at posterior chest wall. Adventitious sounds:- No Adventitious sounds.   Cardiovascular Auscultation:Rhythm- Regular rate and rhythm. Heart Sounds- S1 WNL and S2 WNL. Murmurs & Other Heart Sounds:Auscultation of the heart reveals - No Murmurs.   Abdomen Inspection:Contour- Generalized moderate distention. Palpation/Percussion:Tenderness- Abdomen is non-tender to palpation.  Rigidity (guarding)- Abdomen is soft. Auscultation:Auscultation of the abdomen reveals - Bowel sounds normal.   Male Genitourinary Not done, not pertinent to present illness  Musculoskeletal Well developed male, in no distress. Both knees showed no effusion. Left knee with moderate crepitus on range of motion with range about 0 to 135, tender medial greater than lateral. No instability. Right knee, no effusion. Range is 0 to 135. No tenderness or instability.   Diagnostic Study: MRI scans. He hasHe also has a small meniscal tear medially and laterally on the left.   Assessment & Plan Primary osteoarthritis of both knees (715.16) AVN of both Knees Impression: Left greater Right Knee  Note: Plan is for a Left Total Knee Replacement by Dr. Lequita HaltAluisio.  Plan is to go home.  PCP - Dr. Liliana Clineavid Lee - Patient has been seen preoperatively and felt to be stable for surgery.  The patient does not have any contraindications and will receive TXA (tranexamic acid) prior to surgery.  Signed electronically by Lauraine RinneAlexzandrew L Da Michelle, III PA-C

## 2013-04-20 NOTE — Transfer of Care (Signed)
Immediate Anesthesia Transfer of Care Note  Patient: [name]  Procedure(s) Performed: Procedure(s): LEFT TOTAL KNEE ARTHROPLASTY (Left)  Patient Location: PACU  Anesthesia Type:MAC and Spinal  Level of Consciousness: awake, alert , oriented and patient cooperative  Airway & Oxygen Therapy: Patient Spontanous Breathing and Patient connected to face mask oxygen  Post-op Assessment: Report given to PACU RN and Post -op Vital signs reviewed and stable  Post vital signs: Reviewed and stable  Complications: No apparent anesthesia complications

## 2013-04-20 NOTE — Op Note (Signed)
Pre-operative diagnosis- Avascular necrosis  Left knee(s)  Post-operative diagnosis- Avascular necrosis  Left knee(s)  Procedure-  Left  Total Knee Arthroplasty  Surgeon- Tony RankinFrank V. Mirca Yale, MD  Assistant- Avel Peacerew Perkins, PA-C   Anesthesia-  Spinal EBL-* No blood loss amount entered *  Drains Hemovac  Tourniquet time-  Total Tourniquet Time Documented: Thigh (Left) - 44 minutes Total: Thigh (Left) - 44 minutes    Complications- None  Condition-PACU - hemodynamically stable.   Brief Clinical Note   [name] is a 48 y.o. 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 He has constant pain, with activity and at rest and significant functional deficits with difficulties even with ADLs. He has had extensive non-op management including analgesics, injections of cortisone and viscosupplements, and home exercise program, but remains in significant pain with significant dysfunction. Radiographs show significant medial joint space narrowing and MRI shows multiple foci of avascular bony lesions in the juxta-articular region of the femur and tibia. He presents now for left Total Knee Arthroplasty.     Procedure in detail---   The patient is brought into the operating room and positioned supine on the operating table. After successful administration of  Spinal,   a tourniquet is placed high on the  Left thigh(s) and the lower extremity is prepped and draped in the usual sterile fashion. Time out is performed by the operating team and then the  Left lower extremity is wrapped in Esmarch, knee flexed and the tourniquet inflated to 300 mmHg.       A midline incision is made with a ten blade through the subcutaneous tissue to the level of the extensor mechanism. A fresh blade is used to make a medial parapatellar arthrotomy. Soft tissue over the proximal medial tibia is subperiosteally elevated to the joint line with a knife and into the  semimembranosus bursa with a Cobb elevator. Soft tissue over the proximal lateral tibia is elevated with attention being paid to avoiding the patellar tendon on the tibial tubercle. The patella is everted, knee flexed 90 degrees and the ACL and PCL are removed. Findings are multiple bone infarcts in femur and tibia.        The drill is used to create a starting hole in the distal femur and the canal is thoroughly irrigated with sterile saline to remove the fatty contents. The 5 degree Left  valgus alignment guide is placed into the femoral canal and the distal femoral cutting block is pinned to remove 9 mm off the distal femur. Resection is made with an oscillating saw.      The tibia is subluxed forward and the menisci are removed. The extramedullary alignment guide is placed referencing proximally at the medial aspect of the tibial tubercle and distally along the second metatarsal axis and tibial crest. The block is pinned to remove 2mm off the more deficient medial  side. Resection is made with an oscillating saw. There is necrotic bone present which is debrided to normal appearing cancellous bone. Size 7is the most appropriate size for the tibia and the proximal tibia is prepared with the modular drill and keel punch for that size.      The femoral sizing guide is placed and size 7 is most appropriate. Rotation is marked off the epicondylar axis and confirmed by creating a rectangular flexion gap at 90 degrees. The size 7 cutting block is pinned in this rotation and the anterior, posterior and  chamfer cuts are made with the oscillating saw. The intercondylar block is then placed and that cut is made. There is necrotic bone present which is debrided to normal appearing cancellous bone      Trial size 7 tibial component, trial size 7 posterior stabilized femur and a 10  mm posterior stabilized rotating platform insert trial is placed. Full extension is achieved with excellent varus/valgus and  anterior/posterior balance throughout full range of motion. The patella is everted and thickness measured to be 27  mm. Free hand resection is taken to 15 mm, a 41 template is placed, lug holes are drilled, trial patella is placed, and it tracks normally. Osteophytes are removed off the posterior femur with the trial in place. All trials are removed and the cut bone surfaces prepared with pulsatile lavage. Cement is mixed and once ready for implantation, the size 7 tibial implant, size  7 posterior stabilized femoral component, and the size 41 patella are cemented in place and the patella is held with the clamp. The trial insert is placed and the knee held in full extension. The Exparel (20 ml mixed with 30 ml saline) and .25% Bupivicaine, are injected into the extensor mechanism, posterior capsule, medial and lateral gutters and subcutaneous tissues.  All extruded cement is removed and once the cement is hard the permanent 10 mm posterior stabilized rotating platform insert is placed into the tibial tray.      The wound is copiously irrigated with saline solution and the extensor mechanism closed over a hemovac drain with #1 PDS suture. The tourniquet is released for a total tourniquet time of 44  minutes. Flexion against gravity is 140 degrees and the patella tracks normally. Subcutaneous tissue is closed with 2.0 vicryl and subcuticular with running 4.0 Monocryl. The incision is cleaned and dried and steri-strips and a bulky sterile dressing are applied. The limb is placed into a knee immobilizer and the patient is awakened and transported to recovery in stable condition.      Please note that a surgical assistant was a medical necessity for this procedure in order to perform it in a safe and expeditious manner. Surgical assistant was necessary to retract the ligaments and vital neurovascular structures to prevent injury to them and also necessary for proper positioning of the limb to allow for anatomic  placement of the prosthesis.   Tony Rankin Amillia Biffle, MD    04/20/2013, 11:42 AM

## 2013-04-21 ENCOUNTER — Inpatient Hospital Stay (HOSPITAL_COMMUNITY): Payer: BC Managed Care – PPO

## 2013-04-21 ENCOUNTER — Encounter (HOSPITAL_COMMUNITY): Payer: Self-pay

## 2013-04-21 DIAGNOSIS — E871 Hypo-osmolality and hyponatremia: Secondary | ICD-10-CM

## 2013-04-21 LAB — BASIC METABOLIC PANEL
BUN: 12 mg/dL (ref 6–23)
CHLORIDE: 94 meq/L — AB (ref 96–112)
CO2: 28 meq/L (ref 19–32)
CREATININE: 1.12 mg/dL (ref 0.50–1.35)
Calcium: 7.9 mg/dL — ABNORMAL LOW (ref 8.4–10.5)
GFR calc Af Amer: 89 mL/min — ABNORMAL LOW (ref >90)
GFR calc non Af Amer: 77 mL/min — ABNORMAL LOW (ref >90)
Glucose, Bld: 128 mg/dL — ABNORMAL HIGH (ref 70–99)
Potassium: 4.2 mEq/L (ref 3.7–5.3)
Sodium: 131 mEq/L — ABNORMAL LOW (ref 137–147)

## 2013-04-21 LAB — CBC
HEMATOCRIT: 38.4 % — AB (ref 39.0–52.0)
HEMOGLOBIN: 12.4 g/dL — AB (ref 13.0–17.0)
MCH: 25.9 pg — ABNORMAL LOW (ref 26.0–34.0)
MCHC: 32.3 g/dL (ref 30.0–36.0)
MCV: 80.3 fL (ref 78.0–100.0)
Platelets: 207 10*3/uL (ref 150–400)
RBC: 4.78 MIL/uL (ref 4.22–5.81)
RDW: 15 % (ref 11.5–15.5)
WBC: 9.4 10*3/uL (ref 4.0–10.5)

## 2013-04-21 MED ORDER — IOHEXOL 350 MG/ML SOLN
100.0000 mL | Freq: Once | INTRAVENOUS | Status: AC | PRN
  Administered 2013-04-21: 100 mL via INTRAVENOUS

## 2013-04-21 MED ORDER — CALCITRIOL 0.25 MCG PO CAPS
0.2500 ug | ORAL_CAPSULE | Freq: Two times a day (BID) | ORAL | Status: DC
  Administered 2013-04-21 – 2013-04-22 (×3): 0.25 ug via ORAL
  Filled 2013-04-21 (×4): qty 1

## 2013-04-21 NOTE — Progress Notes (Signed)
Physical Therapy Treatment Patient Details Name: [name] MRN: [id number] DOB: 09/10/1965 Today's Date: 04/21/2013 Time: [id number] PT Time Calculation (min): 11 min  PT Assessment / Plan / Recommendation  History of Present Illness L TKA   PT Comments   Limited there exer and ROM due to c/o [pain  Follow Up Recommendations  Home health PT     Does the patient have the potential to tolerate intense rehabilitation     Barriers to Discharge        Equipment Recommendations  Rolling walker with 5" wheels    Recommendations for Other Services    Frequency 7X/week   Progress towards PT Goals Progress towards PT goals: Progressing toward goals  Plan Current plan remains appropriate    Precautions / Restrictions Precautions Precautions: Fall;Knee Precaution Comments: reports R knee can buckle Required Braces or Orthoses: Knee Immobilizer - Left   Pertinent Vitals/Pain     Mobility  Bed Mobility Bed Mobility: Sit to Supine S   Exercises Total Joint Exercises Ankle Circles/Pumps: AROM;Both;10 reps;Supine Quad Sets: AROM;Both;10 reps;Supine Heel Slides: AAROM;Left;10 reps;Supine Straight Leg Raises: AAROM;Left;10 reps;Supine   PT Diagnosis:    PT Problem List:   PT Treatment Interventions:     PT Goals (current goals can now be found in the care plan section)    Visit Information  Last PT Received On: 04/21/13 Assistance Needed: +1 History of Present Illness: L TKA    Subjective Data      Cognition  Cognition Arousal/Alertness: Awake/alert    Balance     End of Session PT - End of Session Equipment Utilized During Treatment: Gait belt;Left knee immobilizer Activity Tolerance: Patient limited by pain Patient left: in bed;with call bell/phone within reach Nurse Communication: Mobility status CPM Left Knee CPM Left Knee: Off   GP     Rada HayHill, Gaege Sangalang Elizabeth 04/21/2013, 6:17 PM

## 2013-04-21 NOTE — Progress Notes (Signed)
Notified Charter CommunicationsScott Flowers PA about with results from chest CT. Per Radiology results were negative for PE and slight atelectasis. Will continue to monitor.

## 2013-04-21 NOTE — Evaluation (Signed)
Physical Therapy Evaluation Patient Details Name: [name] MRN: [id number] DOB: 02/24/1966 Today's Date: 04/21/2013 Time: [id number] PT Time Calculation (min): 24 min  PT Assessment / Plan / Recommendation History of Present Illness  L TKA  Clinical Impression  Pt  Will most likely need a RW as pt reports R knee is weak, Pt limited by R knee fatigue during gait. Pt will benefit from PT to address problems listed.     PT Assessment  Patient needs continued PT services    Follow Up Recommendations  Home health PT    Does the patient have the potential to tolerate intense rehabilitation      Barriers to Discharge        Equipment Recommendations  Rolling walker with 5" wheels    Recommendations for Other Services     Frequency 7X/week    Precautions / Restrictions Precautions Precautions: Fall Precaution Comments: reports R knee can buckle Required Braces or Orthoses: Knee Immobilizer - Left   Pertinent Vitals/Pain 8 L knee, with medication      Mobility  Bed Mobility Overal bed mobility: Needs Assistance Bed Mobility: Supine to Sit Supine to sit: Mod assist;HOB elevated General bed mobility comments: use of rail, cues for technique. Transfers Overall transfer level: Needs assistance Equipment used: Rolling walker (2 wheeled) Transfers: Sit to/from Stand Sit to Stand: +2 safety/equipment;Mod assist;From elevated surface General transfer comment: cues for technique Ambulation/Gait Ambulation/Gait assistance: +2 safety/equipment;Min assist Ambulation Distance (Feet): 55 Feet Assistive device: Rolling walker (2 wheeled) Gait Pattern/deviations: Step-to pattern;Antalgic General Gait Details: pt had to stop and rest x 2 during walk. Pt reports R kee giving out., Pt wanted to use crutches but  encouraged pt to use RW as R knee is bad.    Exercises     PT Diagnosis: Difficulty walking;Acute pain  PT Problem List: Decreased strength;Decreased range of  motion;Decreased activity tolerance;Decreased mobility;Decreased knowledge of precautions;Decreased safety awareness;Decreased knowledge of use of DME;Pain PT Treatment Interventions: DME instruction;Gait training;Stair training;Functional mobility training;Therapeutic activities;Therapeutic exercise;Patient/family education     PT Goals(Current goals can be found in the care plan section) Acute Rehab PT Goals Patient Stated Goal: I want to walk with crutches PT Goal Formulation: With patient Time For Goal Achievement: 04/25/13 Potential to Achieve Goals: Good  Visit Information  Last PT Received On: 04/21/13 Assistance Needed: +2 History of Present Illness: L TKA       Prior Functioning  Home Living Family/patient expects to be discharged to:: Private residence Living Arrangements: Spouse/significant other Available Help at Discharge: Family Type of Home: House Home Access: Stairs to enter Secretary/administratorntrance Stairs-Number of Steps: 2 Entrance Stairs-Rails: Right Home Layout: Two level;Full bath on main level Alternate Level Stairs-Number of Steps: 14 Alternate Level Stairs-Rails: Right;Left Home Equipment: Crutches Prior Function Level of Independence: Independent Communication Communication: No difficulties    Cognition  Cognition Arousal/Alertness: Awake/alert Behavior During Therapy: WFL for tasks assessed/performed Overall Cognitive Status: Within Functional Limits for tasks assessed    Extremity/Trunk Assessment Upper Extremity Assessment Upper Extremity Assessment: Overall WFL for tasks assessed Lower Extremity Assessment Lower Extremity Assessment: LLE deficits/detail LLE Deficits / Details: unable to perform SLR without max assist.   Balance    End of Session PT - End of Session Equipment Utilized During Treatment: Gait belt;Left knee immobilizer Activity Tolerance: Patient limited by fatigue Patient left: in chair;with call bell/phone within reach Nurse  Communication: Mobility status  GP     Rada HayHill, Khloe Hunkele Elizabeth 04/21/2013, 1:34 PM

## 2013-04-21 NOTE — Progress Notes (Signed)
RN called MD to let him know that patients sats remain in the high 80's. RT spoke with MD to let him know that patients sats only remain in the high 80's. MD asked for a chest x [name] to be taken and a radiologist to read back stat. RT let RN know of the MD's order. RT will continue to monitor.

## 2013-04-21 NOTE — Anesthesia Postprocedure Evaluation (Signed)
  Anesthesia Post-op Note  Patient: [name]  Procedure(s) Performed: Procedure(s) (LRB): LEFT TOTAL KNEE ARTHROPLASTY (Left)  Patient Location: PACU  Anesthesia Type: Spinal  Level of Consciousness: awake and alert   Airway and Oxygen Therapy: Patient Spontanous Breathing  Post-op Pain: mild  Post-op Assessment: Post-op Vital signs reviewed, Patient's Cardiovascular Status Stable, Respiratory Function Stable, Patent Airway and No signs of Nausea or vomiting  Last Vitals:  Filed Vitals:   04/21/13 0630  BP: 133/79  Pulse: 98  Temp: 36.8 C  Resp: 16    Post-op Vital Signs: stable   Complications: No apparent anesthesia complications

## 2013-04-21 NOTE — Progress Notes (Signed)
OT Cancellation Note  Patient Details Name: [name] MRN: [id number] DOB: 10/24/1965   Cancelled Treatment:    Reason Eval/Treat Not Completed: OT screened, no needs identified, will sign off.  Pt has had hip surgery and feels he and wife will be OK with adls/bathroom transfers.  He does need a 3:1 commode.    Jamonica Schoff 04/21/2013, 1:18 PM Marica OtterMaryellen Shaneta Cervenka, OTR/L [phone] 04/21/2013

## 2013-04-21 NOTE — Progress Notes (Signed)
   Subjective: 1 Day Post-Op Procedure(s) (LRB): LEFT TOTAL KNEE ARTHROPLASTY (Left) Patient reports pain as mild and moderate.   Patient seen in rounds with Dr. Lequita HaltAluisio. Rough night but a little better on rounds. Patient is well, but has had some minor complaints of pain in the knee, requiring pain medications We will start therapy today.  Plan is to go Home after hospital stay.  Objective: Vital signs in last 24 hours: Temp:  [96.8 F (36 C)-99 F (37.2 C)] 98.2 F (36.8 C) (02/17 0630) Pulse Rate:  [75-101] 98 (02/17 0630) Resp:  [13-19] 16 (02/17 0630) BP: (128-152)/(77-96) 133/79 mmHg (02/17 0630) SpO2:  [89 %-100 %] 94 % (02/17 0700) Weight:  [153.939 kg (339 lb 6 oz)] 153.939 kg (339 lb 6 oz) (02/16 2200)  Intake/Output from previous day:  Intake/Output Summary (Last 24 hours) at 04/21/13 0929 Last data filed at 04/21/13 0700  Gross per 24 hour  Intake   4170 ml  Output 1879.5 ml  Net 2290.5 ml    Intake/Output this shift: UOP 1105 since MN +2290  Labs:  Recent Labs  04/21/13 0453  HGB 12.4*    Recent Labs  04/21/13 0453  WBC 9.4  RBC 4.78  HCT 38.4*  PLT 207    Recent Labs  04/21/13 0453  NA 131*  K 4.2  CL 94*  CO2 28  BUN 12  CREATININE 1.12  GLUCOSE 128*  CALCIUM 7.9*   No results found for this basename: LABPT, INR,  in the last 72 hours  EXAM General - Patient is Alert, Appropriate and Oriented Extremity - Neurovascular intact Sensation intact distally Dressing - dressing C/D/I Motor Function - intact, moving foot and toes well on exam.  Hemovac pulled without difficulty.  Past Medical History  Diagnosis Date  . Pneumonia   . Arthritis   . Hx of Graves' disease   . Thyroid disease   . Hypothyroidism     Assessment/Plan: 1 Day Post-Op Procedure(s) (LRB): LEFT TOTAL KNEE ARTHROPLASTY (Left) Principal Problem:   OA (osteoarthritis) of knee Active Problems:   Hyponatremia  Estimated body mass index is 43.55 kg/(m^2)  as calculated from the following:   Height as of this encounter: 6\' 2"  (1.88 m).   Weight as of this encounter: 153.939 kg (339 lb 6 oz). Advance diet Up with therapy Plan for discharge tomorrow Discharge home with home health  DVT Prophylaxis - Xarelto Weight-Bearing as tolerated to left leg D/C O2 and Pulse OX and try on Room Air  Arielys Wandersee, Marlowe SaxLEXZANDREW 04/21/2013, 9:29 AM

## 2013-04-21 NOTE — Progress Notes (Signed)
RT placed patient on CPAP with nasal mask on auto titrate settings of Ipap of 20 and Epap of 8. RT Will continue to monitor patient.

## 2013-04-21 NOTE — Progress Notes (Signed)
Physical Therapy Treatment Patient Details Name: Tony CornerMark C Wolfe MRN: [id number] DOB: 11/06/1965 Today's Date: 04/21/2013 Time: [id number] PT Time Calculation (min): 15 min  PT Assessment / Plan / Recommendation  History of Present Illness L TKA   PT Comments   Pt experiencing significant L knee pain. Medication due, RN aware.  Recommended pt that a RW will be safest due to R knee weakness. Will reasses tomorrow for safest AD.  Follow Up Recommendations  Home health PT     Does the patient have the potential to tolerate intense rehabilitation     Barriers to Discharge        Equipment Recommendations  Rolling walker with 5" wheels    Recommendations for Other Services    Frequency 7X/week   Progress towards PT Goals Progress towards PT goals: Progressing toward goals  Plan Current plan remains appropriate    Precautions / Restrictions Precautions Precautions: Fall;Knee Precaution Comments: reports R knee can buckle Required Braces or Orthoses: Knee Immobilizer - Left   Pertinent Vitals/Pain 10 L knee. Ice applied.    Mobility  Bed Mobility Overal bed mobility: Needs Assistance Bed Mobility: Sit to Supine Supine to sit: Mod assist;HOB elevated Sit to supine: Max assist General bed mobility comments: assist Both legs onto bed. Transfers Overall transfer level: Needs assistance Equipment used: Rolling walker (2 wheeled) Transfers: Sit to/from Stand Sit to Stand: +2 safety/equipment;Mod assist General transfer comment: cues for technique, pt had difficulty rising, cues for hand palcement and LLE position. Ambulation/Gait Ambulation/Gait assistance: +2 safety/equipment;Min assist Ambulation Distance (Feet): 55 Feet Assistive device: Rolling walker (2 wheeled) Gait Pattern/deviations: Step-to pattern;Antalgic General Gait Details: pt in too much pain for ambulation    Exercises     PT Diagnosis: Difficulty walking;Acute pain  PT Problem List: Decreased  strength;Decreased range of motion;Decreased activity tolerance;Decreased mobility;Decreased knowledge of precautions;Decreased safety awareness;Decreased knowledge of use of DME;Pain PT Treatment Interventions: DME instruction;Gait training;Stair training;Functional mobility training;Therapeutic activities;Therapeutic exercise;Patient/family education   PT Goals (current goals can now be found in the care plan section) Acute Rehab PT Goals Patient Stated Goal: I want to walk with crutches PT Goal Formulation: With patient Time For Goal Achievement: 04/25/13 Potential to Achieve Goals: Good  Visit Information  Last PT Received On: 04/21/13 Assistance Needed: +2 History of Present Illness: L TKA    Subjective Data  Patient Stated Goal: I want to walk with crutches   Cognition  Cognition Arousal/Alertness: Awake/alert Behavior During Therapy: WFL for tasks assessed/performed Overall Cognitive Status: Within Functional Limits for tasks assessed    Balance     End of Session PT - End of Session Equipment Utilized During Treatment: Gait belt;Left knee immobilizer Activity Tolerance: Patient limited by pain Patient left: in bed;with call bell/phone within reach Nurse Communication: Mobility status;Patient requests pain meds CPM Left Knee CPM Left Knee: Off   GP     Rada HayHill, Josafat Enrico Elizabeth 04/21/2013, 4:51 PM

## 2013-04-21 NOTE — Progress Notes (Signed)
RT asked RN to call MD and ask to have an order put in for a full face mask since patient sats continue to drop.  Patient is on 14 cmH2o and 10 L oxygen bleed in. Patients sats remain to be 88 to 89%. RT will continue to monitor.

## 2013-04-22 LAB — BASIC METABOLIC PANEL
BUN: 14 mg/dL (ref 6–23)
CO2: 27 mEq/L (ref 19–32)
Calcium: 8.8 mg/dL (ref 8.4–10.5)
Chloride: 97 mEq/L (ref 96–112)
Creatinine, Ser: 0.98 mg/dL (ref 0.50–1.35)
GFR calc Af Amer: >90 mL/min (ref >90)
GFR calc non Af Amer: >90 mL/min (ref >90)
Glucose, Bld: 134 mg/dL — ABNORMAL HIGH (ref 70–99)
Potassium: 4.4 mEq/L (ref 3.7–5.3)
Sodium: 134 mEq/L — ABNORMAL LOW (ref 137–147)

## 2013-04-22 LAB — CBC
HEMATOCRIT: 37 % — AB (ref 39.0–52.0)
Hemoglobin: 12.1 g/dL — ABNORMAL LOW (ref 13.0–17.0)
MCH: 26.3 pg (ref 26.0–34.0)
MCHC: 32.7 g/dL (ref 30.0–36.0)
MCV: 80.4 fL (ref 78.0–100.0)
Platelets: 206 10*3/uL (ref 150–400)
RBC: 4.6 MIL/uL (ref 4.22–5.81)
RDW: 15.1 % (ref 11.5–15.5)
WBC: 13.3 10*3/uL — ABNORMAL HIGH (ref 4.0–10.5)

## 2013-04-22 MED ORDER — METHOCARBAMOL 500 MG PO TABS: 500.0000 mg | ORAL_TABLET | Freq: Four times a day (QID) | ORAL | Status: AC | PRN

## 2013-04-22 MED ORDER — RIVAROXABAN 10 MG PO TABS: 10.0000 mg | ORAL_TABLET | Freq: Every day | ORAL | Status: AC

## 2013-04-22 MED ORDER — OXYCODONE HCL 5 MG PO TABS: 5.0000 mg | ORAL_TABLET | ORAL | Status: AC | PRN

## 2013-04-22 MED ORDER — TRAMADOL HCL 50 MG PO TABS: 50.0000 mg | ORAL_TABLET | Freq: Four times a day (QID) | ORAL | Status: AC | PRN

## 2013-04-22 NOTE — Progress Notes (Signed)
Physical Therapy Treatment Patient Details Name: Tony CornerMark C Wolfe MRN: [id number] DOB: 01/25/1966 Today's Date: 04/22/2013 Time: 1018-1100 PT Time Calculation (min): 42 min  PT Assessment / Plan / Recommendation  History of Present Illness L TKA   PT Comments   Assisted pt OOB to amb with B crutches per pt request.  Practiced going up/down one step then back to bed to perform TKR TE's following HEP handout.  Followed by ICE. Pt ready for D/C to home.   Follow Up Recommendations  Home health PT     Does the patient have the potential to tolerate intense rehabilitation     Barriers to Discharge        Equipment Recommendations  Rolling walker with 5" wheels    Recommendations for Other Services    Frequency 7X/week   Progress towards PT Goals Progress towards PT goals: Progressing toward goals  Plan Current plan remains appropriate    Precautions / Restrictions Precautions Precautions: Fall;Knee Restrictions Weight Bearing Restrictions: No   Pertinent Vitals/Pain C/o 6/10 during TE's Pre medicated ICE applied    Mobility  Bed Mobility Bed Mobility: Supine to Sit;Sit to Supine Supine to sit: Supervision;Min guard Sit to supine: Supervision;Min guard General bed mobility comments: demon and instructed pt how to use a belt to assist L LE off/on bed Transfers Overall transfer level: Needs assistance Equipment used: Rolling walker (2 wheeled) Transfers: Sit to/from Stand Sit to Stand: Supervision;Min guard General transfer comment: increased time with <25% VC's on safety with turn completion prior to sit Ambulation/Gait Ambulation/Gait assistance: Min guard;Min assist Ambulation Distance (Feet): 85 Feet Assistive device: Crutches Gait Pattern/deviations: Step-to pattern;Decreased stance time - left Gait velocity: decreased General Gait Details: Pt stated his home is "not condusive for the walker" so he requested we amb  with B crutches.  required increased time and <25%  VC's for saafety. Stairs: Yes Stairs assistance: Min assist Number of Stairs: 1 (one and one) General stair comments: 25% VC's on proper sequencing and safety.  Performed twice.    Exercises   Total Knee Replacement TE's 10 reps B LE ankle pumps 10 reps towel squeezes 10 reps knee presses 10 reps heel slides  10 reps SAQ's 10 reps SLR's 10 reps ABD Followed by ICE   PT Goals (current goals can now be found in the care plan section)    Visit Information  Last PT Received On: 04/22/13 Assistance Needed: +1 History of Present Illness: L TKA    Subjective Data      Cognition       Balance     End of Session PT - End of Session Equipment Utilized During Treatment: Gait belt Activity Tolerance: Patient tolerated treatment well Patient left: in bed;with call bell/phone within reach   Felecia ShellingLori Tyrika Wolfe  [facility]A [facility]  Acute  Rehab Pager      [phone]

## 2013-04-22 NOTE — Progress Notes (Signed)
   Subjective: 2 Days Post-Op Procedure(s) (LRB): LEFT TOTAL KNEE ARTHROPLASTY (Left) Patient reports pain as mild.   Patient seen in rounds with Dr. Lequita HaltAluisio. Patient is well, and has had no acute complaints or problems Patient is ready to go home   Objective: Vital signs in last 24 hours: Temp:  [98 F (36.7 C)-98.7 F (37.1 C)] 98 F (36.7 C) (02/18 0609) Pulse Rate:  [81-88] 81 (02/18 0609) Resp:  [16] 16 (02/18 0609) BP: (121-136)/(78-85) 135/85 mmHg (02/18 0609) SpO2:  [86 %-100 %] 93 % (02/18 0609)  Intake/Output from previous day:  Intake/Output Summary (Last 24 hours) at 04/22/13 0734 Last data filed at 04/22/13 [id number]  Gross per 24 hour  Intake 1086.5 ml  Output   3590 ml  Net -2503.5 ml    Intake/Output this shift:    Labs:  Recent Labs  04/21/13 0453 04/22/13 0410  HGB 12.4* 12.1*    Recent Labs  04/21/13 0453 04/22/13 0410  WBC 9.4 13.3*  RBC 4.78 4.60  HCT 38.4* 37.0*  PLT 207 206    Recent Labs  04/21/13 0453 04/22/13 0410  NA 131* 134*  K 4.2 4.4  CL 94* 97  CO2 28 27  BUN 12 14  CREATININE 1.12 0.98  GLUCOSE 128* 134*  CALCIUM 7.9* 8.8   No results found for this basename: LABPT, INR,  in the last 72 hours  EXAM: General - Patient is Alert, Appropriate and Oriented Extremity - Neurovascular intact Sensation intact distally Incision - clean, dry, no drainage, healing Motor Function - intact, moving foot and toes well on exam.   Assessment/Plan: 2 Days Post-Op Procedure(s) (LRB): LEFT TOTAL KNEE ARTHROPLASTY (Left) Procedure(s) (LRB): LEFT TOTAL KNEE ARTHROPLASTY (Left) Past Medical History  Diagnosis Date  . Pneumonia   . Arthritis   . Hx of Graves' disease   . Thyroid disease   . Hypothyroidism    Principal Problem:   OA (osteoarthritis) of knee Active Problems:   Hyponatremia  Estimated body mass index is 43.55 kg/(m^2) as calculated from the following:   Height as of this encounter: 6\' 2"  (1.88 m).   Weight  as of this encounter: 153.939 kg (339 lb 6 oz). Up with therapy Discharge home with home health Diet - Regular diet Follow up - in 2 weeks Activity - WBAT Disposition - Home Condition Upon Discharge - Good D/C Meds - See DC Summary DVT Prophylaxis - Xarelto  PERKINS, ALEXZANDREW 04/22/2013, 7:34 AM

## 2013-04-22 NOTE — Discharge Summary (Signed)
Physician Discharge Summary   Patient ID: [name] MRN: [id number] DOB/AGE: 08-May-1965 48 y.o.  Admit date: 04/20/2013 Discharge date: 04/22/2013  Primary Diagnosis:  Avascular necrosis Left knee(s)  Admission Diagnoses:  Past Medical History  Diagnosis Date  . Pneumonia   . Arthritis   . Hx of Graves' disease   . Thyroid disease   . Hypothyroidism    Discharge Diagnoses:   Principal Problem:   OA (osteoarthritis) of knee Active Problems:   Hyponatremia  Estimated body mass index is 43.55 kg/(m^2) as calculated from the following:   Height as of this encounter: 6' 2"  (1.88 m).   Weight as of this encounter: 153.939 kg (339 lb 6 oz).  Procedure:  Procedure(s) (LRB): LEFT TOTAL KNEE ARTHROPLASTY (Left)   Consults: None  HPI: [name] is Wolfe 48 y.o. 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 He has constant pain, with activity and at rest and significant functional deficits with difficulties even with ADLs. He has had extensive non-op management including analgesics, injections of cortisone and viscosupplements, and home exercise program, but remains in significant pain with significant dysfunction. Radiographs show significant medial joint space narrowing and MRI shows multiple foci of avascular bony lesions in the juxta-articular region of the femur and tibia. He presents now for left Total Knee Arthroplasty.   Laboratory Data: Admission on 04/20/2013, Discharged on 04/22/2013  Component Date Value Ref Range Status  . ABO/RH(D) 04/20/2013 O POS   Final  . Antibody Screen 04/20/2013 NEG   Final  . Sample Expiration 04/20/2013 04/23/2013   Final  . WBC 04/21/2013 9.4  4.0 - 10.5 K/uL Final   WHITE COUNT CONFIRMED ON SMEAR  . RBC 04/21/2013 4.78  4.22 - 5.81 MIL/uL Final  . Hemoglobin 04/21/2013 12.4* 13.0 - 17.0 g/dL Final  . HCT 04/21/2013 38.4* 39.0 - 52.0 % Final  . MCV 04/21/2013 80.3  78.0 -  100.0 fL Final  . MCH 04/21/2013 25.9* 26.0 - 34.0 pg Final  . MCHC 04/21/2013 32.3  30.0 - 36.0 g/dL Final  . RDW 04/21/2013 15.0  11.5 - 15.5 % Final  . Platelets 04/21/2013 207  150 - 400 K/uL Final  . Sodium 04/21/2013 131* 137 - 147 mEq/L Final  . Potassium 04/21/2013 4.2  3.7 - 5.3 mEq/L Final  . Chloride 04/21/2013 94* 96 - 112 mEq/L Final  . CO2 04/21/2013 28  19 - 32 mEq/L Final  . Glucose, Bld 04/21/2013 128* 70 - 99 mg/dL Final  . BUN 04/21/2013 12  6 - 23 mg/dL Final  . Creatinine, Ser 04/21/2013 1.12  0.50 - 1.35 mg/dL Final  . Calcium 04/21/2013 7.9* 8.4 - 10.5 mg/dL Final  . GFR calc non Af Amer 04/21/2013 77* >90 mL/min Final  . GFR calc Af Amer 04/21/2013 89* >90 mL/min Final   Comment: (NOTE)                          The eGFR has been calculated using the CKD EPI equation.                          This calculation has not been validated in all clinical situations.                          eGFR's persistently <90 mL/min signify possible  Chronic Kidney                          Disease.  . WBC 04/22/2013 13.3* 4.0 - 10.5 K/uL Final  . RBC 04/22/2013 4.60  4.22 - 5.81 MIL/uL Final  . Hemoglobin 04/22/2013 12.1* 13.0 - 17.0 g/dL Final  . HCT 04/22/2013 37.0* 39.0 - 52.0 % Final  . MCV 04/22/2013 80.4  78.0 - 100.0 fL Final  . MCH 04/22/2013 26.3  26.0 - 34.0 pg Final  . MCHC 04/22/2013 32.7  30.0 - 36.0 g/dL Final  . RDW 04/22/2013 15.1  11.5 - 15.5 % Final  . Platelets 04/22/2013 206  150 - 400 K/uL Final  . Sodium 04/22/2013 134* 137 - 147 mEq/L Final  . Potassium 04/22/2013 4.4  3.7 - 5.3 mEq/L Final  . Chloride 04/22/2013 97  96 - 112 mEq/L Final  . CO2 04/22/2013 27  19 - 32 mEq/L Final  . Glucose, Bld 04/22/2013 134* 70 - 99 mg/dL Final  . BUN 04/22/2013 14  6 - 23 mg/dL Final  . Creatinine, Ser 04/22/2013 0.98  0.50 - 1.35 mg/dL Final  . Calcium 04/22/2013 8.8  8.4 - 10.5 mg/dL Final  . GFR calc non Af Amer 04/22/2013 >90  >90 mL/min Final  . GFR calc Af Amer  04/22/2013 >90  >90 mL/min Final   Comment: (NOTE)                          The eGFR has been calculated using the CKD EPI equation.                          This calculation has not been validated in all clinical situations.                          eGFR's persistently <90 mL/min signify possible Chronic Kidney                          Disease.  Hospital Outpatient Visit on 04/10/2013  Component Date Value Ref Range Status  . aPTT 04/10/2013 29  24 - 37 seconds Final  . WBC 04/10/2013 7.3  4.0 - 10.5 K/uL Final  . RBC 04/10/2013 5.28  4.22 - 5.81 MIL/uL Final  . Hemoglobin 04/10/2013 14.0  13.0 - 17.0 g/dL Final  . HCT 04/10/2013 43.2  39.0 - 52.0 % Final  . MCV 04/10/2013 81.8  78.0 - 100.0 fL Final  . MCH 04/10/2013 26.5  26.0 - 34.0 pg Final  . MCHC 04/10/2013 32.4  30.0 - 36.0 g/dL Final  . RDW 04/10/2013 15.5  11.5 - 15.5 % Final  . Platelets 04/10/2013 210  150 - 400 K/uL Final  . Sodium 04/10/2013 138  137 - 147 mEq/L Final  . Potassium 04/10/2013 3.9  3.7 - 5.3 mEq/L Final  . Chloride 04/10/2013 98  96 - 112 mEq/L Final  . CO2 04/10/2013 30  19 - 32 mEq/L Final  . Glucose, Bld 04/10/2013 133* 70 - 99 mg/dL Final  . BUN 04/10/2013 18  6 - 23 mg/dL Final  . Creatinine, Ser 04/10/2013 1.05  0.50 - 1.35 mg/dL Final  . Calcium 04/10/2013 9.0  8.4 - 10.5 mg/dL Final  . Total Protein 04/10/2013 8.0  6.0 - 8.3 g/dL Final  . Albumin 04/10/2013 4.0  3.5 - 5.2 g/dL Final  . AST 04/10/2013 15  0 - 37 U/L Final  . ALT 04/10/2013 24  0 - 53 U/L Final  . Alkaline Phosphatase 04/10/2013 94  39 - 117 U/L Final  . Total Bilirubin 04/10/2013 0.4  0.3 - 1.2 mg/dL Final  . GFR calc non Af Amer 04/10/2013 83* >90 mL/min Final  . GFR calc Af Amer 04/10/2013 >90  >90 mL/min Final   Comment: (NOTE)                          The eGFR has been calculated using the CKD EPI equation.                          This calculation has not been validated in all clinical situations.                           eGFR's persistently <90 mL/min signify possible Chronic Kidney                          Disease.  Marland Kitchen Prothrombin Time 04/10/2013 12.3  11.6 - 15.2 seconds Final  . INR 04/10/2013 0.93  0.00 - 1.49 Final  . Color, Urine 04/10/2013 YELLOW  YELLOW Final  . APPearance 04/10/2013 CLEAR  CLEAR Final  . Specific Gravity, Urine 04/10/2013 1.026  1.005 - 1.030 Final  . pH 04/10/2013 5.5  5.0 - 8.0 Final  . Glucose, UA 04/10/2013 NEGATIVE  NEGATIVE mg/dL Final  . Hgb urine dipstick 04/10/2013 NEGATIVE  NEGATIVE Final  . Bilirubin Urine 04/10/2013 NEGATIVE  NEGATIVE Final  . Ketones, ur 04/10/2013 NEGATIVE  NEGATIVE mg/dL Final  . Protein, ur 04/10/2013 NEGATIVE  NEGATIVE mg/dL Final  . Urobilinogen, UA 04/10/2013 0.2  0.0 - 1.0 mg/dL Final  . Nitrite 04/10/2013 NEGATIVE  NEGATIVE Final  . Leukocytes, UA 04/10/2013 SMALL* NEGATIVE Final  . MRSA, PCR 04/10/2013 NEGATIVE  NEGATIVE Final  . Staphylococcus aureus 04/10/2013 POSITIVE* NEGATIVE Final   Comment:                                 The Xpert SA Assay (FDA                          approved for NASAL specimens                          in patients over 46 years of age),                          is one component of                          Wolfe comprehensive surveillance                          program.  Test performance has                          been validated by [employer] for patients greater                          than or equal to 16 year old.                          It is not intended                          to diagnose infection nor to                          guide or monitor treatment.  . Squamous Epithelial / LPF 04/10/2013 RARE  RARE Final  . WBC, UA 04/10/2013 7-10  <3 WBC/hpf Final  . Bacteria, UA 04/10/2013 RARE  RARE Final     X-Rays:Ct Angio Chest Pe W/cm &/or Wo Cm  04/21/2013   CLINICAL DATA:  Oxygen desaturation.  EXAM: CT ANGIOGRAPHY CHEST WITH CONTRAST  TECHNIQUE: Multidetector CT imaging  of the chest was performed using the standard protocol during bolus administration of intravenous contrast. Multiplanar CT image reconstructions and MIPs were obtained to evaluate the vascular anatomy.  CONTRAST:  152m OMNIPAQUE IOHEXOL 350 MG/ML SOLN  COMPARISON:  Chest radiograph April 07, 2008.  FINDINGS: Main pulmonary artery is enlarged at 4 cm in transaxial dimension. Bolus timing is slightly early, may decrease sensitivity, however no pulmonary arterial filling defects seen to the subsegmental branches.  Heart and pericardium are unremarkable. Thoracic aorta is normal in course and caliber with mild calcific atherosclerosis of the aortic arch.  No pleural effusions or focal consolidations. Mild atelectasis in the lower lobes bilaterally. Tracheobronchial tree is patent, midline. No pneumothorax. 3-4 mm aortopulmonary window lymph nodes without lymphadenopathy by CT size criteria.  Included view of the abdomen is nonsuspicious though not tailored for evaluation. Soft tissues are nonsuspicious.  Review of the MIP images confirms the above findings.  IMPRESSION: No pulmonary embolism.  Bilateral lower lobe atelectasis.  Enlarged main pulmonary artery can be seen with pulmonary arterial hypertension.   Electronically Signed   By: CElon Alas  On: 04/21/2013 02:27    EKG:No orders found for this or any previous visit.   Hospital Course: [name] 48y.o. [facility] They were brought to the operating room on 04/20/2013 and underwent Procedure(s): LEFT TOTAL KNEE ARTHROPLASTY.  Patient tolerated the procedure well and was later transferred to the recovery room and then to the orthopaedic floor for postoperative care.  They were given PO and IV analgesics for pain control following their surgery.  They were given 24 hours of postoperative antibiotics of  Anti-infectives   Start     Dose/Rate Route Frequency Ordered Stop   04/20/13 1600  ceFAZolin (ANCEF) IVPB 2  g/50 mL premix     2 g 100 mL/hr over 30 Minutes Intravenous Every 6 hours 04/20/13 1406 04/20/13 2342   04/20/13 0600  ceFAZolin (ANCEF) 3 g in dextrose 5 % 50 mL IVPB     3 g 160 mL/hr over 30 Minutes Intravenous On call to O.R. 04/19/13 2009 04/20/13 1028     and started on DVT prophylaxis in the form of Xarelto.   PT and OT were ordered for total joint protocol.  Discharge planning consulted to help with postop disposition and equipment needs.  Patient had Wolfe  rough night on the evening of surgery with pain.  They started to get up OOB with therapy on day one. Hemovac drain was pulled without difficulty.  Continued to work with therapy into day two.  Dressing was changed on day two and the incision was healing well.  Patient was seen in rounds on day two and was ready to go home.   Discharge Medications: Prior to Admission medications   Medication Sig Start Date End Date Taking? Authorizing Provider  calcium carbonate (TUMS - DOSED IN MG ELEMENTAL CALCIUM) 500 MG chewable tablet Chew 1 tablet by mouth 3 (three) times daily.   Yes Historical Provider, MD  levothyroxine (SYNTHROID, LEVOTHROID) 200 MCG tablet Take 200 mcg by mouth daily before breakfast.   Yes Historical Provider, MD  calcitRIOL (ROCALTROL) 0.25 MCG capsule Take 0.25 mcg by mouth 2 (two) times daily.    Historical Provider, MD  methocarbamol (ROBAXIN) 500 MG tablet Take 1 tablet (500 mg total) by mouth every 6 (six) hours as needed for muscle spasms. 04/22/13   Cleotha Tsang, PA-C  oxyCODONE (OXY IR/ROXICODONE) 5 MG immediate release tablet Take 1-2 tablets (5-10 mg total) by mouth every 3 (three) hours as needed for moderate pain, severe pain or breakthrough pain. 04/22/13   Tabria Steines Dara Lords, PA-C  rivaroxaban (XARELTO) 10 MG TABS tablet Take 1 tablet (10 mg total) by mouth daily with breakfast. Take Xarelto for two and Wolfe half more weeks, then discontinue Xarelto. Once the patient has completed the blood thinner regimen,  then take Wolfe Baby 81 mg Aspirin daily for four more weeks. 04/22/13   Nicola Heinemann Dara Lords, PA-C  traMADol (ULTRAM) 50 MG tablet Take 1-2 tablets (50-100 mg total) by mouth every 6 (six) hours as needed (mild to moderate pain). 04/22/13   Nazim Kadlec Dara Lords, PA-C   Discharge home with home health  Diet - Regular diet  Follow up - in 2 weeks  Activity - WBAT  Disposition - Home  Condition Upon Discharge - Good  D/C Meds - See DC Summary  DVT Prophylaxis - Xarelto       Discharge Orders   Future Orders Complete By Expires   Call MD / Call 911  As directed    Comments:     If you experience chest pain or shortness of breath, CALL 911 and be transported to the hospital emergency room.  If you develope Wolfe fever above 101 F, pus (white drainage) or increased drainage or redness at the wound, or calf pain, call your surgeon's office.   Change dressing  As directed    Comments:     Change dressing daily with sterile 4 x 4 inch gauze dressing and apply TED hose. Do not submerge the incision under water.   Constipation Prevention  As directed    Comments:     Drink plenty of fluids.  Prune juice may be helpful.  You may use Wolfe stool softener, such as Colace (over the counter) 100 mg twice Wolfe day.  Use MiraLax (over the counter) for constipation as needed.   Diet - low sodium heart healthy  As directed    Discharge instructions  As directed    Comments:     Pick up stool softner and laxative for home. Do not submerge incision under water. May shower. Continue to use ice for pain and swelling from surgery. Take Xarelto for two and Wolfe half more weeks, then discontinue Xarelto. Once the patient has completed the blood thinner regimen, then take Wolfe Baby  81 mg Aspirin daily for four more weeks.   Do not put Wolfe pillow under the knee. Place it under the heel.  As directed    Do not sit on low chairs, stoools or toilet seats, as it may be difficult to get up from low surfaces  As directed    Driving  restrictions  As directed    Comments:     No driving until released by the physician.   Increase activity slowly as tolerated  As directed    Lifting restrictions  As directed    Comments:     No lifting until released by the physician.   Patient may shower  As directed    Comments:     You may shower without Wolfe dressing once there is no drainage.  Do not wash over the wound.  If drainage remains, do not shower until drainage stops.   TED hose  As directed    Comments:     Use stockings (TED hose) for 3 weeks on both leg(s).  You may remove them at night for sleeping.   Weight bearing as tolerated  As directed    Questions:     Laterality:     Extremity:         Medication List         calcitRIOL 0.25 MCG capsule  Commonly known as:  ROCALTROL  Take 0.25 mcg by mouth 2 (two) times daily.     calcium carbonate 500 MG chewable tablet  Commonly known as:  TUMS - dosed in mg elemental calcium  Chew 1 tablet by mouth 3 (three) times daily.     levothyroxine 200 MCG tablet  Commonly known as:  SYNTHROID, LEVOTHROID  Take 200 mcg by mouth daily before breakfast.     methocarbamol 500 MG tablet  Commonly known as:  ROBAXIN  Take 1 tablet (500 mg total) by mouth every 6 (six) hours as needed for muscle spasms.     oxyCODONE 5 MG immediate release tablet  Commonly known as:  Oxy IR/ROXICODONE  Take 1-2 tablets (5-10 mg total) by mouth every 3 (three) hours as needed for moderate pain, severe pain or breakthrough pain.     rivaroxaban 10 MG Tabs tablet  Commonly known as:  XARELTO  - Take 1 tablet (10 mg total) by mouth daily with breakfast. Take Xarelto for two and Wolfe half more weeks, then discontinue Xarelto.  - Once the patient has completed the blood thinner regimen, then take Wolfe Baby 81 mg Aspirin daily for four more weeks.     traMADol 50 MG tablet  Commonly known as:  ULTRAM  Take 1-2 tablets (50-100 mg total) by mouth every 6 (six) hours as needed (mild to moderate  pain).       Follow-up Information   Follow up with Gearlean Alf, MD. Schedule an appointment as soon as possible for Wolfe visit in 2 weeks.   Specialty:  Orthopedic Surgery   Contact information:   [address] [place] [address] [place] [address] [phone]       Signed: Mickel Crow 04/29/2013, 9:31 PM

## 2013-04-22 NOTE — Progress Notes (Signed)
[facility]  [facility] [employer]  If patient discharges after hours, please call [phone].   Renard HamperLecretia Williamson 04/22/2013, 11:50 AM

## 2013-04-22 NOTE — Progress Notes (Signed)
Utilization review completed.  

## 2013-04-22 NOTE — Discharge Instructions (Addendum)
° °Dr. Frank [name] °Total Joint Specialist °[facility] °[address] °[place], [place] [address] °[phone] ° °TOTAL KNEE REPLACEMENT POSTOPERATIVE DIRECTIONS ° ° ° °Knee Rehabilitation, Guidelines Following Surgery  °Results after knee surgery are often greatly improved when you follow the exercise, range of motion and muscle strengthening exercises prescribed by your doctor. Safety measures are also important to protect the knee from further injury. Any time any of these exercises cause you to have increased pain or swelling in your knee joint, decrease the amount until you are comfortable again and slowly increase them. If you have problems or questions, call your caregiver or physical therapist for advice.  ° °HOME CARE INSTRUCTIONS  °Remove items at home which could result in a fall. This includes throw rugs or furniture in walking pathways.  °Continue medications as instructed at time of discharge. °You may have some home medications which will be placed on hold until you complete the course of blood thinner medication.  °You may start showering once you are discharged home but do not submerge the incision under water. Just pat the incision dry and apply a dry gauze dressing on daily. °Walk with walker as instructed.  °You may resume a sexual relationship in one month or when given the OK by  your doctor.  °· Use walker as long as suggested by your caregivers. °· Avoid periods of inactivity such as sitting longer than an hour when not asleep. This helps prevent blood clots.  °You may put full weight on your legs and walk as much as is comfortable.  °You may return to work once you are cleared by your doctor.  °Do not drive a car for 6 weeks or until released by you surgeon.  °· Do not drive while taking narcotics.  °Wear the elastic stockings for three weeks following surgery during the day but you may remove then at night. °Make sure you keep all of your appointments after your  operation with all of your doctors and caregivers. You should call the office at the above phone number and make an appointment for approximately two weeks after the date of your surgery. °Change the dressing daily and reapply a dry dressing each time. °Please pick up a stool softener and laxative for home use as long as you are requiring pain medications. °· Continue to use ice on the knee for pain and swelling from surgery. You may notice swelling that will progress down to the foot and ankle.  This is normal after surgery.  Elevate the leg when you are not up walking on it.   °It is important for you to complete the blood thinner medication as prescribed by your doctor. °· Continue to use the breathing machine which will help keep your temperature down.  It is common for your temperature to cycle up and down following surgery, especially at night when you are not up moving around and exerting yourself.  The breathing machine keeps your lungs expanded and your temperature down. ° °RANGE OF MOTION AND STRENGTHENING EXERCISES  °Rehabilitation of the knee is important following a knee injury or an operation. After just a few days of immobilization, the muscles of the thigh which control the knee become weakened and shrink (atrophy). Knee exercises are designed to build up the tone and strength of the thigh muscles and to improve knee motion. Often times heat used for twenty to thirty minutes before working out will loosen up your tissues and help with improving the   range of motion but do not use heat for the first two weeks following surgery. These exercises can be done on a training (exercise) mat, on the floor, on a table or on a bed. Use what ever works the best and is most comfortable for you Knee exercises include:  °Leg Lifts - While your knee is still immobilized in a splint or cast, you can do straight leg raises. Lift the leg to 60 degrees, hold for 3 sec, and slowly lower the leg. Repeat 10-20 times 2-3  times daily. Perform this exercise against resistance later as your knee gets better.  °Quad and Hamstring Sets - Tighten up the muscle on the front of the thigh (Quad) and hold for 5-10 sec. Repeat this 10-20 times hourly. Hamstring sets are done by pushing the foot backward against an object and holding for 5-10 sec. Repeat as with quad sets.  °A rehabilitation program following serious knee injuries can speed recovery and prevent re-injury in the future due to weakened muscles. Contact your doctor or a physical therapist for more information on knee rehabilitation.  ° °SKILLED REHAB INSTRUCTIONS: °If the patient is transferred to a skilled rehab facility following release from the hospital, a list of the current medications will be sent to the facility for the patient to continue.  When discharged from the skilled rehab facility, please have the facility set up the patient's Home Health Physical Therapy prior to being released. Also, the skilled facility will be responsible for providing the patient with their medications at time of release from the facility to include their pain medication, the muscle relaxants, and their blood thinner medication. If the patient is still at the rehab facility at time of the two week follow up appointment, the skilled rehab facility will also need to assist the patient in arranging follow up appointment in our office and any transportation needs. ° °MAKE SURE YOU:  °Understand these instructions.  °Will watch your condition.  °Will get help right away if you are not doing well or get worse.  ° ° °Pick up stool softner and laxative for home. °Do not submerge incision under water. °May shower. °Continue to use ice for pain and swelling from surgery. ° °Take Xarelto for two and a half more weeks, then discontinue Xarelto. °Once the patient has completed the blood thinner regimen, then take a Baby 81 mg Aspirin daily for four more weeks. ° ° ° °Information on my medicine - XARELTO®  (Rivaroxaban) ° °This medication education was reviewed with me or my healthcare representative as part of my discharge preparation.  The pharmacist that spoke with me during my hospital stay was:  Absher, Randall K, RPH ° °Why was Xarelto® prescribed for you? °Xarelto® was prescribed for you to reduce the risk of blood clots forming after orthopedic surgery. The medical term for these abnormal blood clots is venous thromboembolism (VTE). ° °What do you need to know about xarelto® ? °Take your Xarelto® ONCE DAILY at the same time every day. °You may take it either with or without food. ° °If you have difficulty swallowing the tablet whole, you may crush it and mix in applesauce just prior to taking your dose. ° °Take Xarelto® exactly as prescribed by your doctor and DO NOT stop taking Xarelto® without talking to the doctor who prescribed the medication.  Stopping without other VTE prevention medication to take the place of Xarelto® may increase your risk of developing a clot. ° °After discharge, you should have regular   check-up appointments with your healthcare provider that is prescribing your Xarelto®.   ° °What do you do if you miss a dose? °If you miss a dose, take it as soon as you remember on the same day then continue your regularly scheduled once daily regimen the next day. Do not take two doses of Xarelto® on the same day.  ° °Important Safety Information °A possible side effect of Xarelto® is bleeding. You should call your healthcare provider right away if you experience any of the following: °  Bleeding from an injury or your nose that does not stop. °  Unusual colored urine (red or dark brown) or unusual colored stools (red or black). °  Unusual bruising for unknown reasons. °  A serious fall or if you hit your head (even if there is no bleeding). ° °Some medicines may interact with Xarelto® and might increase your risk of bleeding while on Xarelto®. To help avoid this, consult your healthcare provider  or pharmacist prior to using any new prescription or non-prescription medications, including herbals, vitamins, non-steroidal anti-inflammatory drugs (NSAIDs) and supplements. ° °This website has more information on Xarelto®: www.xarelto.com. ° ° °

## 2013-04-22 NOTE — Care Management Note (Signed)
    Page 1 of 1   04/22/2013     5:13:31 PM   CARE MANAGEMENT NOTE 04/22/2013  Patient:  [name],Tony C   Account Number:  [id number]  Date Initiated:  04/22/2013  Documentation initiated by:  Colleen CanMANNING,Orlander Norwood  Subjective/Objective Assessment:   total knee replacemnt-left     Action/Plan:   Cm spoke with patient. 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 He already has crutches. Does not want RW or 3n1. [facility]   Anticipated DC Date:  04/22/2013   Anticipated DC Plan:  HOME W HOME HEALTH SERVICES      DC Planning Services  CM consult      [facility]   Choice offered to / List presented to:  C-1 Patient        HH arranged  HH-2 PT      [facility]  [facility]   Status of service:  Completed, signed off Medicare Important Message given?   (If response is "NO", the following Medicare IM given date fields will be blank) Date Medicare IM given:   Date Additional Medicare IM given:    Discharge Disposition:  HOME W HOME HEALTH SERVICES  Per UR Regulation:  Reviewed for med. necessity/level of care/duration of stay  If discussed at Long Length of Stay Meetings, dates discussed:    Comments:

## 2013-10-08 ENCOUNTER — Other Ambulatory Visit: Payer: Self-pay | Admitting: Orthopedic Surgery

## 2013-10-08 NOTE — Progress Notes (Signed)
Preoperative surgical orders have been place into the Epic hospital system for [name] on 10/08/2013, 10:34 AM  by Patrica DuelPERKINS, Ayshia Gramlich for surgery on 11/16/2013.  Preop Total Knee orders including Experal, IV Tylenol, and IV Decadron as long as there are no contraindications to the above medications. Avel Peacerew Jerald Villalona, PA-C

## 2013-11-16 ENCOUNTER — Encounter (HOSPITAL_COMMUNITY): Admission: RE | Payer: Self-pay | Source: Ambulatory Visit

## 2013-11-16 ENCOUNTER — Inpatient Hospital Stay (HOSPITAL_COMMUNITY)
Admission: RE | Admit: 2013-11-16 | Payer: BC Managed Care – PPO | Source: Ambulatory Visit | Admitting: Orthopedic Surgery

## 2013-11-16 SURGERY — ARTHROPLASTY, KNEE, TOTAL
Anesthesia: Choice | Site: Knee | Laterality: Right

## 2014-06-10 IMAGING — CT CT ANGIO CHEST
1 of 7 series · 19 of 36 positions shown · IV contrast (OMNIPAQUE)
Comparison: Chest radiograph April 07, 2008.

CLINICAL DATA: Oxygen desaturation.

EXAM:
CT ANGIOGRAPHY CHEST WITH CONTRAST
TECHNIQUE: Multidetector CT imaging of the chest was performed using the
standard protocol during bolus administration of intravenous
contrast. Multiplanar CT image reconstructions and MIPs were
obtained to evaluate the vascular anatomy.
CONTRAST:  100mL OMNIPAQUE IOHEXOL 350 MG/ML SOLN

[Series 6: pe thins @ 1mm · axial · 0.74mm/px · z∈[-208,+12]mm · 19 of 246 slices shown]
[im 13/246  lung]
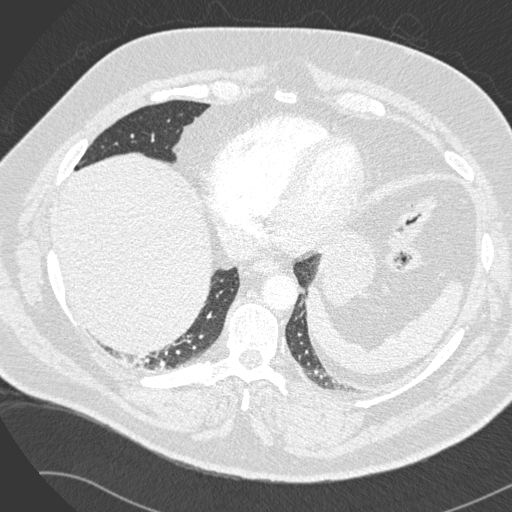
[im 25/246  mediastinal]
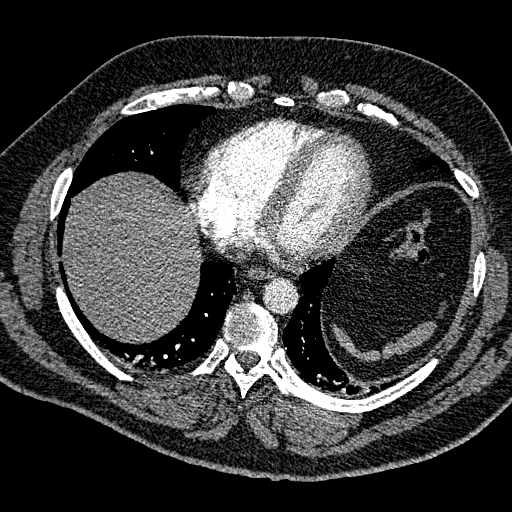
[im 37/246  lung]
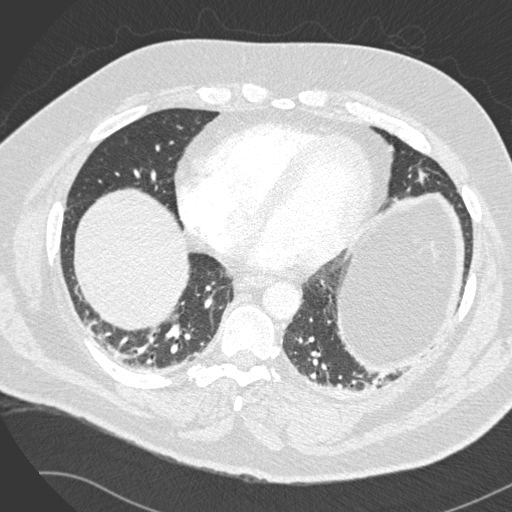
[im 50/246  mediastinal]
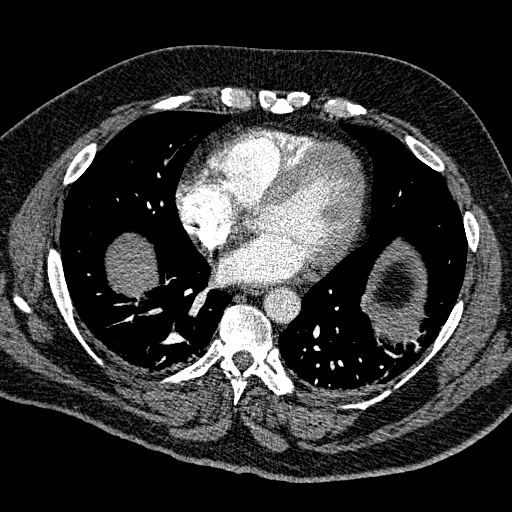
[im 62/246  lung]
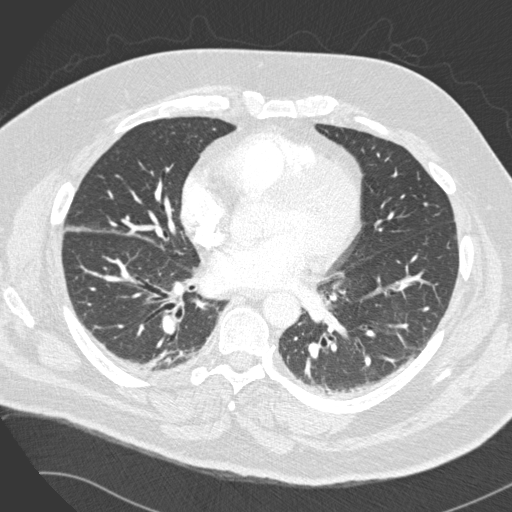
[im 74/246  mediastinal]
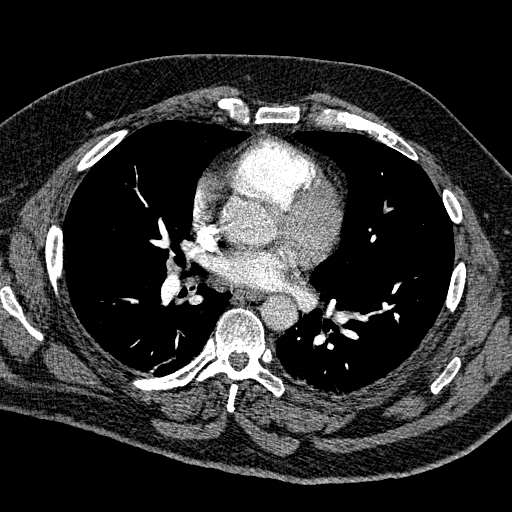
[im 86/246  lung]
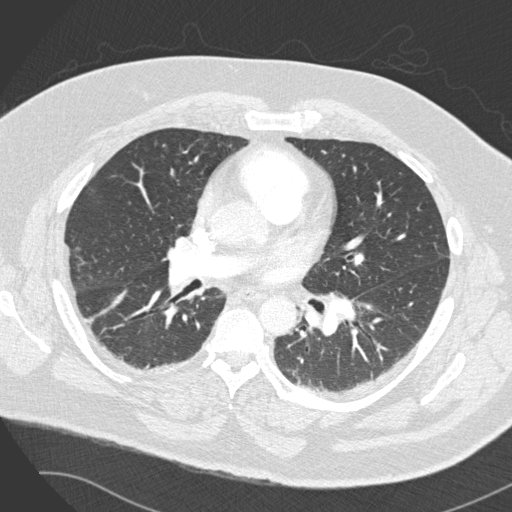
[im 99/246  mediastinal]
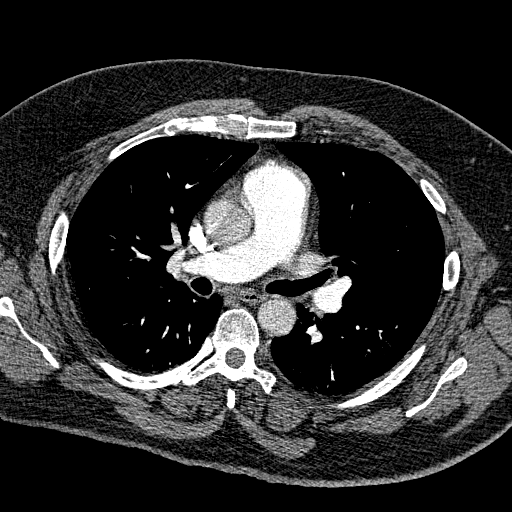
[im 111/246  lung]
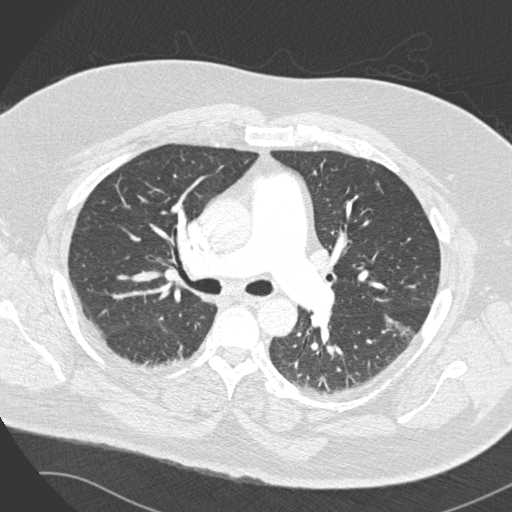
[im 123/246  mediastinal]
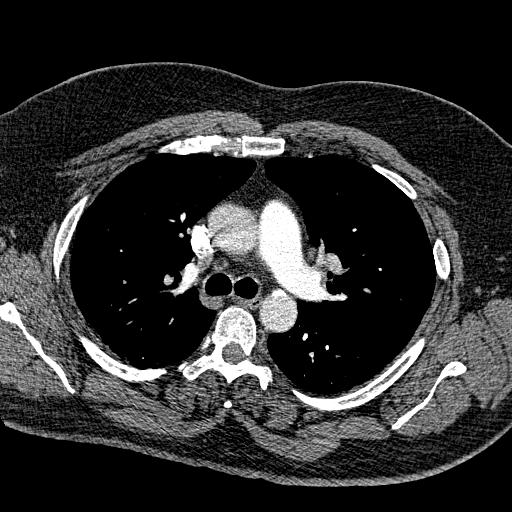
[im 135/246  lung]
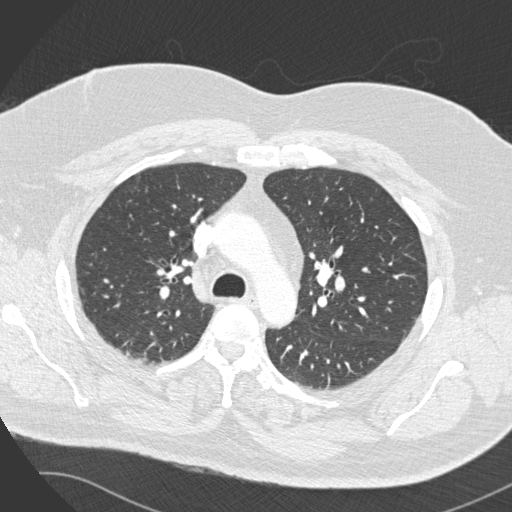
[im 148/246  mediastinal]
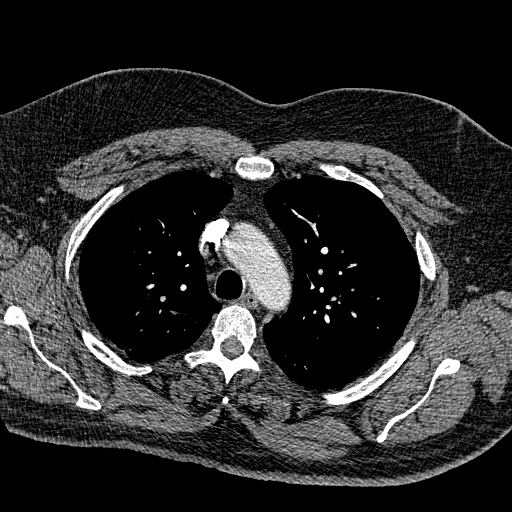
[im 160/246  lung]
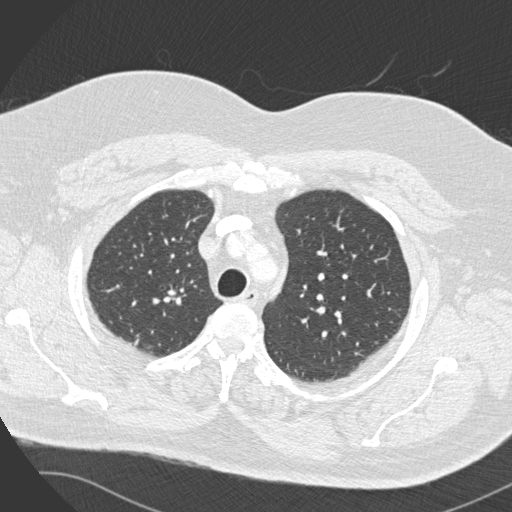
[im 172/246  mediastinal]
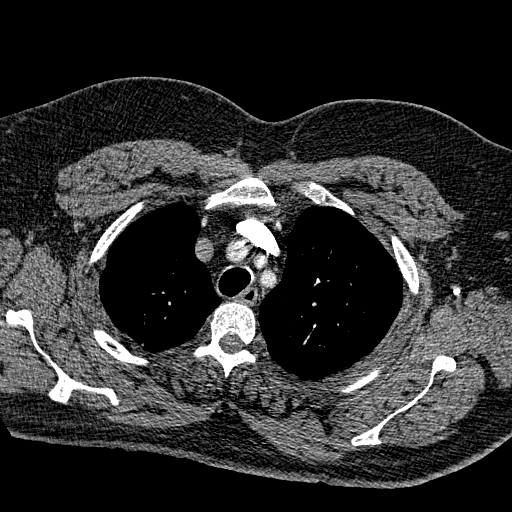
[im 184/246  lung]
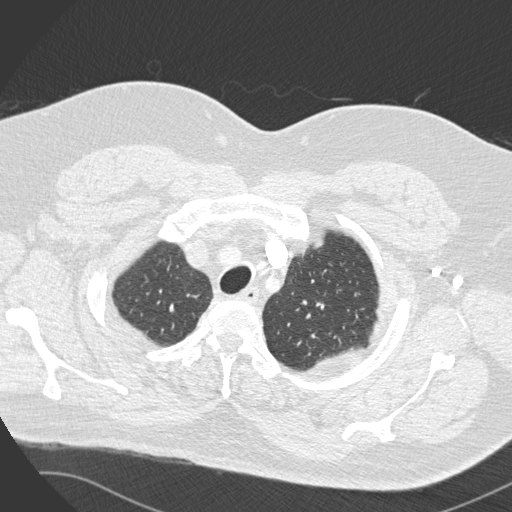
[im 197/246  mediastinal]
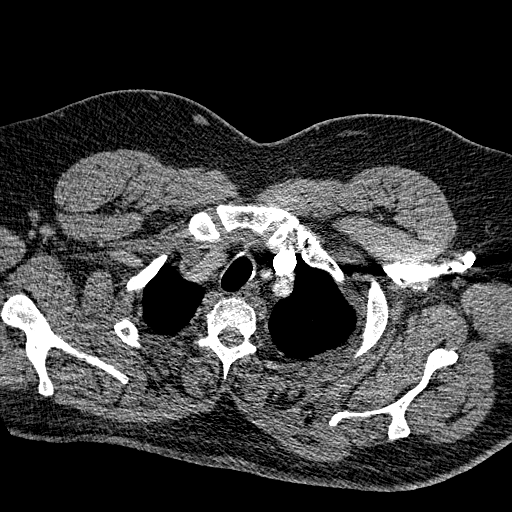
[im 209/246  lung]
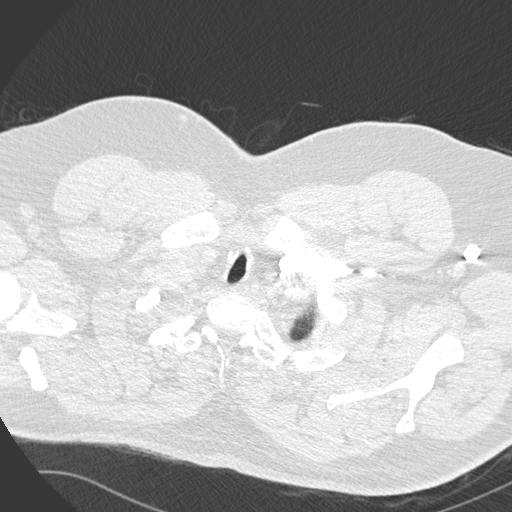
[im 221/246  mediastinal]
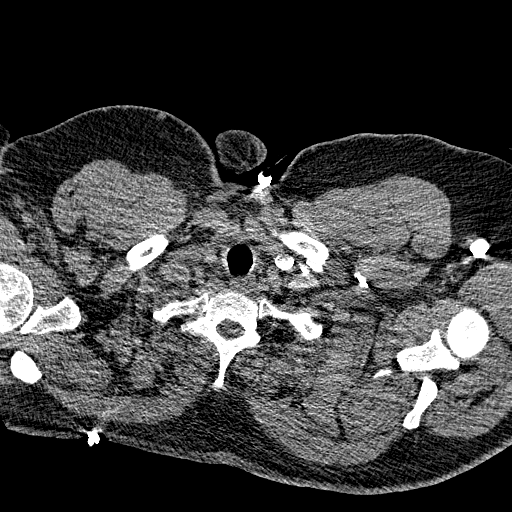
[im 233/246  lung]
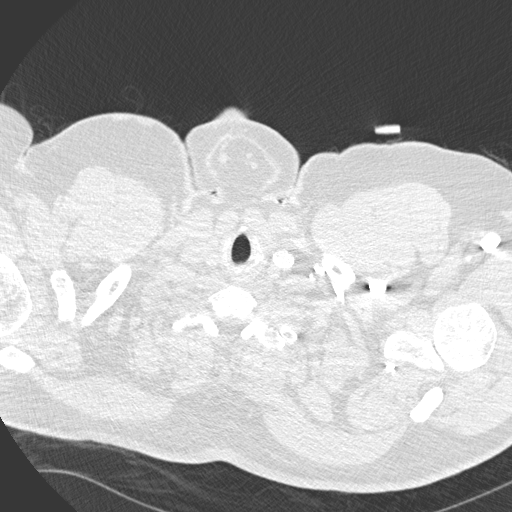

[19 of 36 positions shown; findings below may reference images not displayed]

FINDINGS: Main pulmonary artery is enlarged at 4 cm in transaxial dimension.
Bolus timing is slightly early, may decrease sensitivity, however no
pulmonary arterial filling defects seen to the subsegmental
branches.

Heart and pericardium are unremarkable. Thoracic aorta is normal in
course and caliber with mild calcific atherosclerosis of the aortic
arch.

No pleural effusions or focal consolidations. Mild atelectasis in
the lower lobes bilaterally. Tracheobronchial tree is patent,
midline. No pneumothorax. 3-4 mm aortopulmonary window lymph nodes
without lymphadenopathy by CT size criteria.

Included view of the abdomen is nonsuspicious though not tailored
for evaluation. Soft tissues are nonsuspicious.

Review of the MIP images confirms the above findings.
IMPRESSION: No pulmonary embolism.

Bilateral lower lobe atelectasis.

Enlarged main pulmonary artery can be seen with pulmonary arterial
hypertension.

  By: On Maru

## 2024-03-15 DIAGNOSIS — R0602 Shortness of breath: Secondary | ICD-10-CM | POA: Diagnosis not present

## 2024-03-15 DIAGNOSIS — R7981 Abnormal blood-gas level: Secondary | ICD-10-CM | POA: Diagnosis not present

## 2024-03-15 HISTORY — DX: Essential (primary) hypertension: I10

## 2024-03-15 MED ADMIN — Albuterol Sulfate Soln Nebu 0.083% (2.5 MG/3ML): 2.5 mg | RESPIRATORY_TRACT | NDC 47335070349
